# Patient Record
Sex: Female | Born: 2000 | Race: Black or African American | Hispanic: No | Marital: Single | Smoking: Never smoker
Health system: Southern US, Community
[De-identification: ages and names within clinical notes are randomized; demographics above are authoritative.]

---
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
## 2018-07-31 ENCOUNTER — Encounter (HOSPITAL_COMMUNITY): Payer: Self-pay | Admitting: *Deleted

## 2018-07-31 ENCOUNTER — Other Ambulatory Visit: Payer: Self-pay

## 2018-07-31 ENCOUNTER — Emergency Department (HOSPITAL_COMMUNITY)
Admission: EM | Admit: 2018-07-31 | Discharge: 2018-08-01 | Disposition: A | Discharge status: Home / Self Care | Payer: Medicaid Other | Attending: Emergency Medicine | Admitting: Emergency Medicine

## 2018-07-31 DIAGNOSIS — H1132 Conjunctival hemorrhage, left eye: Secondary | ICD-10-CM

## 2018-07-31 NOTE — ED Triage Notes (Signed)
Pt arrives with c/o broken blood vessel in the left eye that she noticed tonight. No pain, no visual changes. No other complaints

## 2018-07-31 NOTE — ED Notes (Signed)
Bed: WTR7 Expected date:  Expected time:  Means of arrival:  Comments: 

## 2018-08-01 NOTE — ED Provider Notes (Signed)
[facility]-EMERGENCY DEPT Provider Note   CSN: [id number] Arrival date & time: 07/31/18  2202     History   Chief Complaint Chief Complaint  Patient presents with  . Eye Problem    HPI Danielle Robertson is a 17 y.o. female.  17 year old female with no significant past medical history presents to the emergency department for evaluation of left subconjunctival hemorrhage.  She noticed it upon waking this morning.  It was not present before bed when she was taking out her contacts.  Denies any recent eye injury or trauma.  No forceful coughing or recent vomiting.  Denies any eye pain or pain with movement.  No vision changes from baseline.  No treatments attempted prior to arrival.     History reviewed. No pertinent past medical history.  There are no active problems to display for this patient.   History reviewed. No pertinent surgical history.   OB History   None      Home Medications    Prior to Admission medications   Medication Sig Start Date End Date Taking? Authorizing Provider  Acetaminophen-Caffeine (EXCEDRIN TENSION HEADACHE) 500-65 MG TABS Take 1 tablet by mouth every 8 (eight) hours as needed (for headache). Patient not taking: Reported on 10/11/2017 07/22/17   Sherrilee Gilles, NP  EUCRISA 2 % OINT Apply 1 application 2 (two) times daily as needed topically (Rash).  09/01/17   [provider]  HYDROcodone-acetaminophen (NORCO/VICODIN) 5-325 MG per tablet Take 1 tablet by mouth every 6 (six) hours as needed for moderate pain. Patient not taking: Reported on 10/11/2017 10/03/13   Earley Favor, NP  hydroxypropyl methylcellulose / hypromellose (ISOPTO TEARS / GONIOVISC) 2.5 % ophthalmic solution Place 1 drop 3 (three) times daily as needed into both eyes for dry eyes.    [provider]  ibuprofen (ADVIL,MOTRIN) 200 MG tablet Take 600-800 mg every 6 (six) hours as needed by mouth for moderate pain.     [provider]    ibuprofen (ADVIL,MOTRIN) 400 MG tablet Take 1 tablet (400 mg total) by mouth every 6 (six) hours as needed. Patient not taking: Reported on 10/11/2017 10/03/13   Earley Favor, NP  ibuprofen (ADVIL,MOTRIN) 400 MG tablet Take 1 tablet (400 mg total) by mouth every 6 (six) hours as needed for headache. Patient not taking: Reported on 10/11/2017 07/22/17   Sherrilee Gilles, NP    Family History No family history on file.  Social History Social History   Tobacco Use  . Smoking status: Never Smoker  . Smokeless tobacco: Never Used  Substance Use Topics  . Alcohol use: No  . Drug use: No     Allergies   Patient has no known allergies.   Review of Systems Review of Systems Ten systems reviewed and are negative for acute change, except as noted in the HPI.    Physical Exam Updated Vital Signs BP 122/81 (BP Location: Left Arm)   Pulse 87   Temp 97.8 F (36.6 C) (Oral)   Resp 18   LMP 07/12/2018   SpO2 100%   Physical Exam  Constitutional: She is oriented to person, place, and time. She appears well-developed and well-nourished. No distress.  Nontoxic appearing.  Pleasant.  HENT:  Head: Normocephalic and atraumatic.  Eyes: EOM are normal. Left conjunctiva has a hemorrhage. No scleral icterus.    Subconjunctival hemorrhage noted in the left eye.  No proptosis or hyphema.  No pain with eye movements.  Denies vision changes.  Visual  Acuity Bilateral Distance: 20/40  R Distance: 20/30  L Distance: 20/40   Neck: Normal range of motion.  Pulmonary/Chest: Effort normal. No respiratory distress.  Respirations even and unlabored  Musculoskeletal: Normal range of motion.  Neurological: She is alert and oriented to person, place, and time. She exhibits normal muscle tone. Coordination normal.  Skin: Skin is warm and dry. No rash noted. She is not diaphoretic. No erythema. No pallor.  Psychiatric: She has a normal mood and affect. Her behavior is normal.  Nursing note and  vitals reviewed.    ED Treatments / Results  Labs (all labs ordered are listed, but only abnormal results are displayed) Labs Reviewed - No data to display  EKG None  Radiology No results found.  Procedures Procedures (including critical care time)  Medications Ordered in ED Medications - No data to display   Initial Impression / Assessment and Plan / ED Course  I have reviewed the triage vital signs and the nursing notes.  Pertinent labs & imaging results that were available during my care of the patient were reviewed by me and considered in my medical decision making (see chart for details).     Patient with uncomplicated subconjunctival hemorrhage.  Will continue with outpatient supportive management.  Advised patient to refrain from contact use until hemorrhage resolves.  Return precautions discussed and provided. Patient discharged in stable condition with no unaddressed concerns.   Final Clinical Impressions(s) / ED Diagnoses   Final diagnoses:  Subconjunctival hemorrhage of left eye    ED Discharge Orders    None       Antony Madura, PA-C 08/01/18 0021    Nira Conn, MD 08/01/18 [phone]

## 2019-08-14 ENCOUNTER — Other Ambulatory Visit: Payer: Self-pay

## 2019-08-14 ENCOUNTER — Encounter: Payer: Self-pay | Admitting: Physician Assistant

## 2019-08-14 ENCOUNTER — Ambulatory Visit
Admission: EM | Admit: 2019-08-14 | Discharge: 2019-08-14 | Disposition: A | Discharge status: Home / Self Care | Payer: Medicaid Other | Attending: Physician Assistant | Admitting: Physician Assistant

## 2019-08-14 DIAGNOSIS — R51 Headache: Secondary | ICD-10-CM

## 2019-08-14 DIAGNOSIS — S060X9A Concussion with loss of consciousness of unspecified duration, initial encounter: Secondary | ICD-10-CM | POA: Diagnosis not present

## 2019-08-14 DIAGNOSIS — W228XXA Striking against or struck by other objects, initial encounter: Secondary | ICD-10-CM | POA: Diagnosis not present

## 2019-08-14 MED ORDER — KETOROLAC TROMETHAMINE 30 MG/ML IJ SOLN
30.0000 mg | Freq: Once | INTRAMUSCULAR | Status: AC
  Administered 2019-08-14: 15:00:00 30 mg via INTRAMUSCULAR

## 2019-08-14 NOTE — Discharge Instructions (Signed)
You can take ibuprofen 600-800mg  three times a day and tylenol 1000mg  three times a day for headache as needed. Expect more frequent headaches that should be improving throughout the week. Avoid using your brain-- no TV, phone, reading. If experiencing blurry vision, nausea/vomiting, confusion/altered mental status, dizziness, weakness, passing out, imbalance, go to the emergency department for further evaluation.

## 2019-08-14 NOTE — ED Triage Notes (Signed)
Per pt she was feeding her dog last night and hit here head on the faucet when she came back up. Went to bed and woke up with headache today. Took a bath and felt dizzy. Has been more painful since she woke up. Pt said no blurred vision, no numbness or tingling. DiD not have LOC

## 2019-08-14 NOTE — ED Provider Notes (Signed)
[facility]    CSN: [id number] Arrival date & time: 08/14/19  1340      History   Chief Complaint Chief Complaint  Patient presents with  . Headache    HPI Greer Eeyasia Buckholtz is a 18 y.o. female.   18 year old female comes in for evaluation after head injury last night.  States hit her head on the faucet when she was standing up.  Denies loss of consciousness.  She was able to fall asleep after incident, but woke up this morning with headache, dizziness/lightheadedness.  She denies nausea, vomiting, photophobia, phonophobia.  She finds it hard to focus, with worsening headache when looking at her phone/TV.  She denies memory loss/trouble remembering.  Denies confusion, altered mental status.  Denies blurry vision.  Patient took Excedrin without much relief.     No past medical history on file.  There are no active problems to display for this patient.   No past surgical history on file.  OB History   No obstetric history on file.      Home Medications    Prior to Admission medications   Medication Sig Start Date End Date Taking? Authorizing Provider  Acetaminophen-Caffeine (EXCEDRIN TENSION HEADACHE) 500-65 MG TABS Take 1 tablet by mouth every 8 (eight) hours as needed (for headache). Patient not taking: Reported on 10/11/2017 07/22/17   Sherrilee GillesScoville, Brittany N, NP  EUCRISA 2 % OINT Apply 1 application 2 (two) times daily as needed topically (Rash).  09/01/17   [provider]  HYDROcodone-acetaminophen (NORCO/VICODIN) 5-325 MG per tablet Take 1 tablet by mouth every 6 (six) hours as needed for moderate pain. Patient not taking: Reported on 10/11/2017 10/03/13   Earley FavorSchulz, Gail, NP  hydroxypropyl methylcellulose / hypromellose (ISOPTO TEARS / GONIOVISC) 2.5 % ophthalmic solution Place 1 drop 3 (three) times daily as needed into both eyes for dry eyes.    [provider]  ibuprofen (ADVIL,MOTRIN) 200 MG tablet Take 600-800 mg every 6 (six) hours as  needed by mouth for moderate pain.     [provider]  ibuprofen (ADVIL,MOTRIN) 400 MG tablet Take 1 tablet (400 mg total) by mouth every 6 (six) hours as needed. Patient not taking: Reported on 10/11/2017 10/03/13   Earley FavorSchulz, Gail, NP  ibuprofen (ADVIL,MOTRIN) 400 MG tablet Take 1 tablet (400 mg total) by mouth every 6 (six) hours as needed for headache. Patient not taking: Reported on 10/11/2017 07/22/17   Sherrilee GillesScoville, Brittany N, NP    Family History No family history on file.  Social History Social History   Tobacco Use  . Smoking status: Never Smoker  . Smokeless tobacco: Never Used  Substance Use Topics  . Alcohol use: No  . Drug use: No     Allergies   Patient has no known allergies.   Review of Systems Review of Systems  Reason unable to perform ROS: See HPI as above.     Physical Exam Triage Vital Signs ED Triage Vitals [08/14/19 1356]  Enc Vitals Group     BP 129/87     Pulse Rate 97     Resp 16     Temp 98 F (36.7 C)     Temp Source Oral     SpO2 98 %     Weight      Height      Head Circumference      Peak Flow      Pain Score 9     Pain Loc  Pain Edu?      Excl. in [place]?    No data found.  Updated Vital Signs BP 129/87 (BP Location: Right Arm)   Pulse 97   Temp 98 F (36.7 C) (Oral)   Resp 16   LMP 08/07/2019   SpO2 98%   Physical Exam Constitutional:      General: She is not in acute distress.    Appearance: She is well-developed. She is not ill-appearing, toxic-appearing or diaphoretic.  HENT:     Head: Normocephalic and atraumatic.  Eyes:     General: Lids are normal.     Extraocular Movements: Extraocular movements intact.     Conjunctiva/sclera: Conjunctivae normal.     Pupils: Pupils are equal, round, and reactive to light.  Neck:     Musculoskeletal: Normal range of motion and neck supple.  Cardiovascular:     Rate and Rhythm: Normal rate and regular rhythm.     Heart sounds: No murmur. No friction rub. No gallop.    Pulmonary:     Effort: Pulmonary effort is normal. No respiratory distress.     Breath sounds: Normal breath sounds.  Skin:    General: Skin is warm and dry.  Neurological:     Mental Status: She is alert and oriented to person, place, and time.     GCS: GCS eye subscore is 4. GCS verbal subscore is 5. GCS motor subscore is 6.     Cranial Nerves: Cranial nerves are intact.     Sensory: Sensation is intact.     Motor: Motor function is intact.     Coordination: Coordination is intact.     Gait: Gait is intact.     UC Treatments / Results  Labs (all labs ordered are listed, but only abnormal results are displayed) Labs Reviewed - No data to display  EKG   Radiology No results found.  Procedures Procedures (including critical care time)  Medications Ordered in UC Medications  ketorolac (TORADOL) 30 MG/ML injection 30 mg (30 mg Intramuscular Given 08/14/19 1430)    Initial Impression / Assessment and Plan / UC Course  I have reviewed the triage vital signs and the nursing notes.  Pertinent labs & imaging results that were available during my care of the patient were reviewed by me and considered in my medical decision making (see chart for details).    History and exam most consistent with concussion. Will provide symptomatic treatment and continue to monitor. Discussed brain rest. Return precautions given. Otherwise, if still having headaches in 1 week, to follow up with concussion clinic for further evaluation and management needed.  Final Clinical Impressions(s) / UC Diagnoses   Final diagnoses:  Concussion with loss of consciousness, initial encounter   ED Prescriptions    None     PDMP not reviewed this encounter.   Ok Edwards, PA-C 08/14/19 1445

## 2019-09-02 ENCOUNTER — Ambulatory Visit: Payer: Medicaid Other | Admitting: Nurse Practitioner

## 2019-09-22 ENCOUNTER — Ambulatory Visit: Payer: Medicaid Other | Admitting: Obstetrics & Gynecology

## 2019-09-28 ENCOUNTER — Ambulatory Visit: Payer: Medicaid Other

## 2019-11-08 ENCOUNTER — Ambulatory Visit (INDEPENDENT_AMBULATORY_CARE_PROVIDER_SITE_OTHER): Payer: Medicaid Other | Admitting: Advanced Practice Midwife

## 2019-11-08 ENCOUNTER — Other Ambulatory Visit (HOSPITAL_COMMUNITY)
Admission: RE | Admit: 2019-11-08 | Discharge: 2019-11-08 | Disposition: A | Discharge status: Home / Self Care | Payer: Medicaid Other | Source: Ambulatory Visit | Attending: Advanced Practice Midwife | Admitting: Advanced Practice Midwife

## 2019-11-08 ENCOUNTER — Encounter: Payer: Self-pay | Admitting: Advanced Practice Midwife

## 2019-11-08 ENCOUNTER — Other Ambulatory Visit: Payer: Self-pay

## 2019-11-08 VITALS — BP 122/81 | HR 92 | Ht 67.0 in | Wt 127.2 lb

## 2019-11-08 DIAGNOSIS — N898 Other specified noninflammatory disorders of vagina: Secondary | ICD-10-CM | POA: Insufficient documentation

## 2019-11-08 DIAGNOSIS — Z0000 Encounter for general adult medical examination without abnormal findings: Secondary | ICD-10-CM

## 2019-11-08 DIAGNOSIS — B9689 Other specified bacterial agents as the cause of diseases classified elsewhere: Secondary | ICD-10-CM

## 2019-11-08 DIAGNOSIS — R350 Frequency of micturition: Secondary | ICD-10-CM | POA: Diagnosis not present

## 2019-11-08 DIAGNOSIS — N939 Abnormal uterine and vaginal bleeding, unspecified: Secondary | ICD-10-CM

## 2019-11-08 LAB — POCT URINALYSIS DIPSTICK
Bilirubin, UA: NEGATIVE
Blood, UA: NEGATIVE
Glucose, UA: NEGATIVE
Ketones, UA: NEGATIVE
Leukocytes, UA: NEGATIVE
Nitrite, UA: NEGATIVE
Odor: NEGATIVE
Protein, UA: NEGATIVE
Spec Grav, UA: 1.015 (ref 1.010–1.025)
Urobilinogen, UA: 0.2 E.U./dL
pH, UA: 6 (ref 5.0–8.0)

## 2019-11-08 MED ORDER — NORETHIN-ETH ESTRAD-FE BIPHAS 1 MG-10 MCG / 10 MCG PO TABS: 1.0000 | ORAL_TABLET | Freq: Every day | ORAL | 11 refills | Status: DC

## 2019-11-08 MED ORDER — NORETHIN ACE-ETH ESTRAD-FE 1-20 MG-MCG(24) PO TABS: 1.0000 | ORAL_TABLET | Freq: Every day | ORAL | 11 refills | Status: DC

## 2019-11-08 NOTE — Patient Instructions (Addendum)
Some natural remedies/prevention to try for bacterial vaginosis: --Take a probiotic tablet/capsule every day for at least 1-2 months.   --Whenever you have symptoms, use boric acid suppositories vaginally every night for a week.   --Do not use scented soaps/perfumes in the vaginal area, and do not overwash multiple times daily. --Wear breathable cotton underwear and do not wear tight restrictive clothing. --Limit pantyliner use, change your underwear several times daily instead. --Use condoms during intercourse.    Oral Contraception Information Oral contraceptive pills (OCPs) are medicines taken to prevent pregnancy. OCPs are taken by mouth, and they work by:  Preventing the ovaries from releasing eggs.  Thickening mucus in the lower part of the uterus (cervix), which prevents sperm from entering the uterus.  Thinning the lining of the uterus (endometrium), which prevents a fertilized egg from attaching to the endometrium. OCPs are highly effective when taken exactly as prescribed. However, OCPs do not prevent STIs (sexually transmitted infections). Safe sex practices, such as using condoms while on an OCP, can help prevent STIs. Before starting OCPs Before you start taking OCPs, you may have a physical exam, blood test, and Pap test. However, you are not required to have a pelvic exam in order to be prescribed OCPs. Your health care provider will make sure you are a good candidate for oral contraception. OCPs are not a good option for certain women, including women who smoke and are older than 35 years, and women with a medical history of high blood pressure, deep vein thrombosis, pulmonary embolism, stroke, cardiovascular disease, or peripheral vascular disease. Discuss with your health care provider the possible side effects of the OCP you may be prescribed. When you start an OCP, be aware that it can take 2-3 months for your body to adjust to changes in hormone levels. Follow  instructions from your health care provider about how to start taking your first cycle of OCPs. Depending on when you start the pill, you may need to use a backup form of birth control, such as condoms, during the first week. Make sure you know what steps to take if you ever forget to take the pill. Types of oral contraception  The most common types of birth control pills contain the hormones estrogen and progestin (synthetic progesterone) or progestin only. The combination pill This type of pill contains estrogen and progestin hormones. Combination pills often come in packs of 21, 28, or 91 pills. For each pack, the last 7 pills may not contain hormones, which means you may stop taking the pills for 7 days. Menstrual bleeding occurs during the week that you do not take the pills or that you take the pills with no hormones in them. The minipill This type of pill contains the progestin hormone only. It comes in packs of 28 pills. All 28 pills contain the hormone. You take the pill every day. It is very important to take the pill at the same time each day. Advantages of oral contraceptive pills  Provides reliable and continuous contraception if taken as instructed.  May treat or decrease symptoms of: ? Menstrual period cramps. ? Irregular menstrual cycle or bleeding. ? Heavy menstrual flow. ? Abnormal uterine bleeding. ? Acne, depending on the type of pill. ? Polycystic ovarian syndrome. ? Endometriosis. ? Iron deficiency anemia. ? Premenstrual symptoms, including premenstrual dysphoric disorder.  May reduce the risk of endometrial and ovarian cancer.  Can be used as emergency contraception.  Prevents mislocated (ectopic) pregnancies and infections of the fallopian tubes.  Things that can make oral contraceptive pills less effective OCPs can be less effective if:  You forget to take the pill at the same time every day. This is especially important when taking the minipill.  You have a  stomach or intestinal disease that reduces your body's ability to absorb the pill.  You take OCPs with other medicines that make OCPs less effective, such as antibiotics, certain HIV medicines, and some seizure medicines.  You take expired OCPs.  You forget to restart the pill on day 7, if using the packs of 21 pills. Risks associated with oral contraceptive pills Oral contraceptive pills can sometimes cause side effects, such as:  Headache.  Depression.  Trouble sleeping.  Nausea and vomiting.  Breast tenderness.  Irregular bleeding or spotting during the first several months.  Bloating or fluid retention.  Increase in blood pressure. Combination pills are also associated with a small increase in the risk of:  Blood clots.  Heart attack.  Stroke. Summary  Oral contraceptive pills are medicines taken by mouth to prevent pregnancy. They are highly effective when taken exactly as prescribed.  The most common types of birth control pills contain the hormones estrogen and progestin (synthetic progesterone) or progestin only.  Before you start taking the pill, you may have a physical exam, blood test, and Pap test. Your health care provider will make sure you are a good candidate for oral contraception.  The combination pill may come in a 21-day pack, a 28-day pack, or a 91-day pack. The minipill contains the progesterone hormone only and comes in packs of 28 pills.  Oral contraceptive pills can sometimes cause side effects, such as headache, nausea, breast tenderness, or irregular bleeding. This information is not intended to replace advice given to you by your health care provider. Make sure you discuss any questions you have with your health care provider. Document Released: 02/01/2003 Document Revised: 10/24/2017 Document Reviewed: 02/04/2017 Elsevier Patient Education  2020 [employer].

## 2019-11-08 NOTE — Progress Notes (Signed)
Subjective:     Danielle Robertson is a 18 y.o. female here for a routine exam.  Current complaints: Patient states that she has had increasing vaginal itching and "bad odor" for the last 2 months. She endorses clear watery vaginal discharge with no blood. She also endorses more frequent urination and increased urgency (every 10 minutes). Denies dysuria, hematuria.   Patient also reports irregular periods. She states that her periods recur approximately 2x per month and follow an unpredictable pattern. She endorses "heavy" periods that require changing pads every hour without saturating pads.  She denies weight gain or loss, changes in appetite, anxiety, sweats, chills. No changes in BMs, no increase in thirst.   PHQ-2: negative  Do you have a primary care provider? Yes - [facility] and [place]. Recently made an appointment with dentist. How many times per week do you exercise? 0 Do you feel safe at home? Yes Has anyone hit, slapped, or kicked you recently? No Do you feel sad, tired, or upset most days or are you mostly happy with life? No  Social History: Patient denies sexual activity of all forms. She has never smoked or used tobacco products. No drug use. She endorses occasional alcohol use on holidays.  Past Medical History: - Migraines w/o aura - Eczema  Medications: None   Gynecologic History LMP: 10/25/19 Contraception: none. Not sexually active. Last Pap: N/A.  Obstetric History OB History  Gravida Para Term Preterm AB Living  0 0 0 0 0 0  SAB TAB Ectopic Multiple Live Births  0 0 0 0 0   Review of Systems Pertinent items are noted in HPI.    Objective:   VS: BP 122/81, HR 92, BMI 19.92  CV: Normal S1, S2. No murmurs, rubs, gallops. 2+ radial, brachial, dorsalis pedis pulses Pulmonary: CTAB. Normal respiratory effort. No wheezes Abdominal: Soft, non-distended, non-tender to palpation Pelvic: No vaginal erythema, discharge, rashes.    Assessment/Plan:   There are no diagnoses linked to this encounter.   1. Urinary frequency Pt has recent-onset increased urinary frequency and urgency raise concern for possible UTI vs. Cystitis vs. Candidal vaginitis. Given family history of diabetes, will check A1c to rule out diabetes.  - Urine dip - Urine Culture-GYN - Hemoglobin A1c  2. Vaginal discharge Pt endorses vaginal discharge with pruritus and odor changes in the setting of no history of sexual activity. Will evaluate for Candida, BV. - Cervicovaginal ancillary only( [place])  3. Abnormal uterine bleeding (AUB) Pt's history of irregular periods since menarche is likely idiopathic. Potential anovulatory cycles. Normal BMI and consistent irregular period pattern makes PCOS less likely.  -  Norethindrone-Ethinyl Estradiol-Fe Biphas (LO LOESTRIN FE) 1 MG-10 MCG / 10 MCG tablet; Take 1 tablet by mouth daily.  Dispense: 1 Package; Refill: 11  4. Well woman exam (no gynecological exam) Patient is doing well with no major concerns. We provided anticipatory guidance about contraception and prevention of vaginal infections. Advised patient to call or return to clinic if any issues or questions arise.                                 Follow up in: 3 months or as needed.   Cristela Felt, Medical Student 3:27 PM

## 2019-11-08 NOTE — Progress Notes (Signed)
NGYN c/o vaginal discharge with itching and odor.  Pt never sexually active. Pt also c/o urinary frequency. Voids every 10 mins sometimes.  Cycles tend to come late and when they do, it's heavy changing pads every hr or less. Requests BCP to help regulate cycles.

## 2019-11-09 LAB — CERVICOVAGINAL ANCILLARY ONLY
Bacterial Vaginitis (gardnerella): POSITIVE — AB
Candida Glabrata: NEGATIVE
Candida Vaginitis: NEGATIVE
Chlamydia: NEGATIVE
Comment: NEGATIVE
Comment: NEGATIVE
Comment: NEGATIVE
Comment: NEGATIVE
Comment: NEGATIVE
Comment: NORMAL
Neisseria Gonorrhea: NEGATIVE
Trichomonas: NEGATIVE

## 2019-11-09 LAB — HEMOGLOBIN A1C
Est. average glucose Bld gHb Est-mCnc: 97 mg/dL
Hgb A1c MFr Bld: 5 % (ref 4.8–5.6)

## 2019-11-10 LAB — URINE CULTURE

## 2019-11-11 MED ORDER — METRONIDAZOLE 500 MG PO TABS: 500.0000 mg | ORAL_TABLET | Freq: Two times a day (BID) | ORAL | 0 refills | Status: AC

## 2019-11-11 NOTE — Addendum Note (Signed)
Addended by: Fatima Blank A on: 11/11/2019 05:09 PM   Modules accepted: Orders

## 2019-11-18 ENCOUNTER — Ambulatory Visit: Payer: Medicaid Other | Attending: Internal Medicine

## 2019-11-18 DIAGNOSIS — Z20822 Contact with and (suspected) exposure to covid-19: Secondary | ICD-10-CM

## 2019-11-20 LAB — NOVEL CORONAVIRUS, NAA: SARS-CoV-2, NAA: NOT DETECTED

## 2019-12-04 ENCOUNTER — Other Ambulatory Visit: Payer: Self-pay

## 2019-12-04 ENCOUNTER — Ambulatory Visit
Admission: EM | Admit: 2019-12-04 | Discharge: 2019-12-04 | Disposition: A | Discharge status: Home / Self Care | Payer: Medicaid Other | Attending: Physician Assistant | Admitting: Physician Assistant

## 2019-12-04 ENCOUNTER — Encounter: Payer: Self-pay | Admitting: Emergency Medicine

## 2019-12-04 DIAGNOSIS — R059 Cough, unspecified: Secondary | ICD-10-CM

## 2019-12-04 DIAGNOSIS — R05 Cough: Secondary | ICD-10-CM | POA: Diagnosis not present

## 2019-12-04 DIAGNOSIS — Z20822 Contact with and (suspected) exposure to covid-19: Secondary | ICD-10-CM | POA: Diagnosis not present

## 2019-12-04 MED ORDER — ACETAMINOPHEN 500 MG PO TABS: 1000.0000 mg | ORAL_TABLET | Freq: Three times a day (TID) | ORAL | 0 refills | Status: DC | PRN

## 2019-12-04 MED ORDER — IBUPROFEN 800 MG PO TABS: 800.0000 mg | ORAL_TABLET | Freq: Three times a day (TID) | ORAL | 0 refills | Status: DC

## 2019-12-04 MED ORDER — FLUTICASONE PROPIONATE 50 MCG/ACT NA SUSP: 2.0000 | Freq: Every day | NASAL | 0 refills | Status: DC

## 2019-12-04 MED ORDER — ALBUTEROL SULFATE HFA 108 (90 BASE) MCG/ACT IN AERS: 1.0000 | INHALATION_SPRAY | Freq: Four times a day (QID) | RESPIRATORY_TRACT | 0 refills | Status: DC | PRN

## 2019-12-04 NOTE — ED Triage Notes (Signed)
PT presents to [facility] for assessment of headaches, body aches, scratchy throat, upper chest tightness, and temp of 99.9.

## 2019-12-04 NOTE — Discharge Instructions (Signed)
COVID PCR testing ordered. As discussed, given your exposure, I would like you to quarantine for 14 days regardless of results. Tylenol/motrin for pain and fever. Albuterol as needed for chest tightness. You can take over the counter flonase/nasacort to help with nasal congestion/drainage. If experiencing shortness of breath, trouble breathing, go to the emergency department for further evaluation needed.

## 2019-12-04 NOTE — ED Notes (Signed)
Pt updated on delays for room.

## 2019-12-04 NOTE — ED Provider Notes (Signed)
[facility]    CSN: [id number] Arrival date & time: 12/04/19  1025      History   Chief Complaint Chief Complaint  Patient presents with  . Exposure to COVID    HPI Danielle Robertson is a 19 y.o. female.   19 year old female comes in for 2 day of URI symptoms. Throat irritation, minimal cough, headache. Denies nasal congestion, rhinorrhea. Had some chest tightness when laying down, but without shortness of breath, trouble breathing. Body aches, chills with tmax 99.9. has had some periumibilical abdominal cramping that makes her feel like she needs to move her bowels. Has not had BM these past 2 days. Denies nausea, vomiting, diarrhea. Denies loss of taste/smell.      History reviewed. No pertinent past medical history.  There are no problems to display for this patient.   History reviewed. No pertinent surgical history.  OB History    Gravida  0   Para  0   Term  0   Preterm  0   AB  0   Living  0     SAB  0   TAB  0   Ectopic  0   Multiple  0   Live Births  0            Home Medications    Prior to Admission medications   Medication Sig Start Date End Date Taking? Authorizing Provider  acetaminophen (TYLENOL) 500 MG tablet Take 2 tablets (1,000 mg total) by mouth every 8 (eight) hours as needed. 12/04/19   Belinda Fisher, PA-C  Acetaminophen-Caffeine (EXCEDRIN TENSION HEADACHE) 500-65 MG TABS Take 1 tablet by mouth every 8 (eight) hours as needed (for headache). Patient not taking: Reported on 10/11/2017 07/22/17   Sherrilee Gilles, NP  albuterol (VENTOLIN HFA) 108 (90 Base) MCG/ACT inhaler Inhale 1-2 puffs into the lungs every 6 (six) hours as needed for wheezing or shortness of breath. 12/04/19   Cathie Hoops, Emaley Applin V, PA-C  EUCRISA 2 % OINT Apply 1 application 2 (two) times daily as needed topically (Rash).  09/01/17   [provider]  fluticasone (FLONASE) 50 MCG/ACT nasal spray Place 2 sprays into both nostrils daily. 12/04/19   Cathie Hoops, Yuridiana Formanek V,  PA-C  hydroxypropyl methylcellulose / hypromellose (ISOPTO TEARS / GONIOVISC) 2.5 % ophthalmic solution Place 1 drop 3 (three) times daily as needed into both eyes for dry eyes.    [provider]  ibuprofen (ADVIL) 800 MG tablet Take 1 tablet (800 mg total) by mouth 3 (three) times daily. 12/04/19   Cathie Hoops, Eulia Hatcher V, PA-C  Norethindrone-Ethinyl Estradiol-Fe Biphas (LO LOESTRIN FE) 1 MG-10 MCG / 10 MCG tablet Take 1 tablet by mouth daily. 11/08/19   Leftwich-Kirby, Wilmer Floor, CNM    Family History Family History  Problem Relation Age of Onset  . Diabetes Maternal Grandmother   . Diabetes Maternal Grandfather   . COPD Paternal Grandmother        Smoker     Social History Social History   Tobacco Use  . Smoking status: Never Smoker  . Smokeless tobacco: Never Used  Substance Use Topics  . Alcohol use: No  . Drug use: No     Allergies   Patient has no known allergies.   Review of Systems Review of Systems  Reason unable to perform ROS: See HPI as above.     Physical Exam Triage Vital Signs ED Triage Vitals [12/04/19 1111]  Enc Vitals Group  BP 119/84     Pulse Rate (!) 102     Resp 16     Temp (!) 97.5 F (36.4 C)     Temp Source Temporal     SpO2 99 %     Weight      Height      Head Circumference      Peak Flow      Pain Score 7     Pain Loc      Pain Edu?      Excl. in [place]?    No data found.  Updated Vital Signs BP 119/84 (BP Location: Left Arm)   Pulse (!) 102   Temp (!) 97.5 F (36.4 C) (Temporal)   Resp 16   LMP 11/21/2019   SpO2 99%   Physical Exam Constitutional:      General: She is not in acute distress.    Appearance: Normal appearance. She is not ill-appearing, toxic-appearing or diaphoretic.  HENT:     Head: Normocephalic and atraumatic.     Nose:     Right Sinus: No maxillary sinus tenderness or frontal sinus tenderness.     Left Sinus: No maxillary sinus tenderness or frontal sinus tenderness.     Mouth/Throat:     Mouth: Mucous  membranes are moist.     Pharynx: Oropharynx is clear. Uvula midline.  Cardiovascular:     Rate and Rhythm: Normal rate and regular rhythm.     Heart sounds: Normal heart sounds. No murmur. No friction rub. No gallop.   Pulmonary:     Effort: Pulmonary effort is normal. No accessory muscle usage, prolonged expiration, respiratory distress or retractions.     Comments: Lungs clear to auscultation without adventitious lung sounds. Musculoskeletal:     Cervical back: Normal range of motion and neck supple.  Neurological:     General: No focal deficit present.     Mental Status: She is alert and oriented to person, place, and time.      UC Treatments / Results  Labs (all labs ordered are listed, but only abnormal results are displayed) Labs Reviewed  NOVEL CORONAVIRUS, NAA    EKG   Radiology No results found.  Procedures Procedures (including critical care time)  Medications Ordered in UC Medications - No data to display  Initial Impression / Assessment and Plan / UC Course  I have reviewed the triage vital signs and the nursing notes.  Pertinent labs & imaging results that were available during my care of the patient were reviewed by me and considered in my medical decision making (see chart for details).    COVID PCR test ordered. Patient presumed positive regardless of testing results given history and exposure. No alarming signs on exam.  Patient speaking in full sentences without respiratory distress.  Symptomatic treatment discussed.  Push fluids.  Return precautions given.  Patient expresses understanding and agrees to plan.  Final Clinical Impressions(s) / UC Diagnoses   Final diagnoses:  Cough  Suspected COVID-19 virus infection  Close exposure to COVID-19 virus   ED Prescriptions    Medication Sig Dispense Auth. Provider   ibuprofen (ADVIL) 800 MG tablet Take 1 tablet (800 mg total) by mouth 3 (three) times daily. 21 tablet Tanaiya Kolarik V, PA-C   acetaminophen  (TYLENOL) 500 MG tablet Take 2 tablets (1,000 mg total) by mouth every 8 (eight) hours as needed. 30 tablet Rea Reser V, PA-C   albuterol (VENTOLIN HFA) 108 (90 Base) MCG/ACT inhaler Inhale 1-2  puffs into the lungs every 6 (six) hours as needed for wheezing or shortness of breath. 8 g Joffre Lucks V, PA-C   fluticasone (FLONASE) 50 MCG/ACT nasal spray Place 2 sprays into both nostrils daily. 1 g Ok Edwards, PA-C     PDMP not reviewed this encounter.   Ok Edwards, PA-C 12/04/19 1156

## 2019-12-05 LAB — NOVEL CORONAVIRUS, NAA: SARS-CoV-2, NAA: DETECTED — AB

## 2019-12-06 ENCOUNTER — Telehealth (HOSPITAL_COMMUNITY): Payer: Self-pay | Admitting: Emergency Medicine

## 2019-12-06 ENCOUNTER — Encounter (HOSPITAL_COMMUNITY): Payer: Self-pay

## 2019-12-06 ENCOUNTER — Other Ambulatory Visit: Payer: Medicaid Other

## 2019-12-06 NOTE — Telephone Encounter (Signed)
Your test for COVID-19 was positive, meaning that you were infected with the novel coronavirus and could give the germ to others.  Please continue isolation at home for at least 10 days since the start of your symptoms. If you do not have symptoms, please isolate at home for 10 days from the day you were tested. Once you complete your 10 day quarantine, you may return to normal activities as long as you've not had a fever for over 24 hours(without taking fever reducing medicine) and your symptoms are improving. Please continue good preventive care measures, including:  frequent hand-washing, avoid touching your face, cover coughs/sneezes, stay out of crowds and keep a 6 foot distance from others.  Go to the nearest hospital emergency room if fever/cough/breathlessness are severe or illness seems like a threat to life.  Attempted to reach patient. No answer at this time. Voicemail box full.   If you have any questions, you may call me at [phone]

## 2019-12-07 ENCOUNTER — Telehealth (HOSPITAL_COMMUNITY): Payer: Self-pay | Admitting: Emergency Medicine

## 2019-12-07 NOTE — Telephone Encounter (Signed)
Patient contacted by phone and made aware of   pos covid results. Pt verbalized understanding and had all questions answered.    

## 2020-02-11 ENCOUNTER — Ambulatory Visit: Payer: Medicaid Other

## 2020-02-14 ENCOUNTER — Ambulatory Visit: Payer: Medicaid Other

## 2020-03-06 ENCOUNTER — Ambulatory Visit: Payer: Medicaid Other

## 2020-03-23 ENCOUNTER — Ambulatory Visit (INDEPENDENT_AMBULATORY_CARE_PROVIDER_SITE_OTHER): Payer: Medicaid Other | Admitting: *Deleted

## 2020-03-23 ENCOUNTER — Other Ambulatory Visit (HOSPITAL_COMMUNITY)
Admission: RE | Admit: 2020-03-23 | Discharge: 2020-03-23 | Disposition: A | Discharge status: Home / Self Care | Payer: Medicaid Other | Source: Ambulatory Visit | Attending: Obstetrics and Gynecology | Admitting: Obstetrics and Gynecology

## 2020-03-23 ENCOUNTER — Other Ambulatory Visit: Payer: Self-pay

## 2020-03-23 DIAGNOSIS — N898 Other specified noninflammatory disorders of vagina: Secondary | ICD-10-CM | POA: Diagnosis present

## 2020-03-23 NOTE — Progress Notes (Signed)
SUBJECTIVE:  19 y.o. female complains of thin vaginal discharge with some odor for 2 weeks. Denies abnormal vaginal bleeding or significant pelvic pain or fever. No UTI symptoms. Denies history of known exposure to STD.   OBJECTIVE:  She appears well, afebrile.   ASSESSMENT:  Vaginal Discharge  Vaginal Odor   PLAN:  GC, chlamydia, trichomonas, BVAG, CVAG probe sent to lab. Treatment: To be determined once lab results are received ROV prn if symptoms persist or worsen.

## 2020-03-23 NOTE — Progress Notes (Signed)
Agree with A & P. 

## 2020-03-24 LAB — CERVICOVAGINAL ANCILLARY ONLY
Bacterial Vaginitis (gardnerella): NEGATIVE
Candida Glabrata: NEGATIVE
Candida Vaginitis: NEGATIVE
Chlamydia: NEGATIVE
Comment: NEGATIVE
Comment: NEGATIVE
Comment: NEGATIVE
Comment: NEGATIVE
Comment: NEGATIVE
Comment: NORMAL
Neisseria Gonorrhea: NEGATIVE
Trichomonas: NEGATIVE

## 2020-05-11 ENCOUNTER — Ambulatory Visit: Payer: Medicaid Other | Admitting: Women's Health

## 2020-05-17 ENCOUNTER — Ambulatory Visit
Admission: EM | Admit: 2020-05-17 | Discharge: 2020-05-17 | Disposition: A | Discharge status: Home / Self Care | Payer: Medicaid Other | Attending: Emergency Medicine | Admitting: Emergency Medicine

## 2020-05-17 DIAGNOSIS — N760 Acute vaginitis: Secondary | ICD-10-CM | POA: Diagnosis present

## 2020-05-17 MED ORDER — FLUCONAZOLE 150 MG PO TABS: 150.0000 mg | ORAL_TABLET | Freq: Every day | ORAL | 0 refills | Status: DC

## 2020-05-17 NOTE — ED Triage Notes (Signed)
Pt presents to UC for vaginal itching and dischage x2 weeks. Pt also endorses new onset vaginal odor. Pt states she took an old antibiotic for itching, with out relief. Pt denies urinary frequency, painful urination, or abdominal pain. Pt denies n/v/d, fevers or chills.

## 2020-05-17 NOTE — ED Provider Notes (Signed)
[facility]    CSN: [id number] Arrival date & time: 05/17/20  1252      History   Chief Complaint Chief Complaint  Patient presents with  . Vaginal Itching    HPI Danielle Robertson is a 19 y.o. female presenting for 2-week course and irritation.  Endorsing thick, white discharge as well.  No pelvic pain, vaginal bleeding malodor.  No urinary symptoms such as urgency or frequency.  Patient states she has been taking antibiotic for itching without relief.  Patient shows supplement: Prebiotic and probiotic.  No fever, abdominal or back pain.  History reviewed. No pertinent past medical history.  There are no problems to display for this patient.   History reviewed. No pertinent surgical history.  OB History    Gravida  0   Para  0   Term  0   Preterm  0   AB  0   Living  0     SAB  0   TAB  0   Ectopic  0   Multiple  0   Live Births  0            Home Medications    Prior to Admission medications   Medication Sig Start Date End Date Taking? Authorizing Provider  fluconazole (DIFLUCAN) 150 MG tablet Take 1 tablet (150 mg total) by mouth daily. May repeat in 72 hours if needed 05/17/20   Hall-Potvin, [place], PA-C  fluticasone (FLONASE) 50 MCG/ACT nasal spray Place 2 sprays into both nostrils daily. 12/04/19   Tasia Catchings, Amy V, PA-C  albuterol (VENTOLIN HFA) 108 (90 Base) MCG/ACT inhaler Inhale 1-2 puffs into the lungs every 6 (six) hours as needed for wheezing or shortness of breath. 12/04/19 05/17/20  Tasia Catchings, Amy V, PA-C  Norethindrone-Ethinyl Estradiol-Fe Biphas (LO LOESTRIN FE) 1 MG-10 MCG / 10 MCG tablet Take 1 tablet by mouth daily. 11/08/19 05/17/20  Leftwich-Kirby, Kathie Dike, CNM    Family History Family History  Problem Relation Age of Onset  . Diabetes Maternal Grandmother   . Diabetes Maternal Grandfather   . COPD Paternal Grandmother        Smoker     Social History Social History   Tobacco Use  . Smoking status: Never Smoker  . Smokeless  tobacco: Never Used  Vaping Use  . Vaping Use: Never used  Substance Use Topics  . Alcohol use: No  . Drug use: No     Allergies   Patient has no known allergies.   Review of Systems As per HPI   Physical Exam Triage Vital Signs ED Triage Vitals  Enc Vitals Group     BP      Pulse      Resp      Temp      Temp src      SpO2      Weight      Height      Head Circumference      Peak Flow      Pain Score      Pain Loc      Pain Edu?      Excl. in [place]?    No data found.  Updated Vital Signs BP (!) 122/92 (BP Location: Left Arm)   Pulse 89   Temp 97.8 F (36.6 C) (Oral)   Resp 18   LMP 04/28/2020 (Exact Date)   SpO2 99%   Visual Acuity Right Eye Distance:   Left Eye Distance:   Bilateral Distance:  Right Eye Near:   Left Eye Near:    Bilateral Near:     Physical Exam Constitutional:      General: She is not in acute distress. HENT:     Head: Normocephalic and atraumatic.  Eyes:     General: No scleral icterus.    Pupils: Pupils are equal, round, and reactive to light.  Cardiovascular:     Rate and Rhythm: Normal rate.  Pulmonary:     Effort: Pulmonary effort is normal.  Abdominal:     General: Bowel sounds are normal.     Palpations: Abdomen is soft.     Tenderness: There is no abdominal tenderness. There is no right CVA tenderness, left CVA tenderness or guarding.  Genitourinary:    Comments: Patient declined, self-swab performed Skin:    Coloration: Skin is not jaundiced or pale.  Neurological:     Mental Status: She is alert and oriented to person, place, and time.      UC Treatments / Results  Labs (all labs ordered are listed, but only abnormal results are displayed) Labs Reviewed  CERVICOVAGINAL ANCILLARY ONLY    EKG   Radiology No results found.  Procedures Procedures (including critical care time)  Medications Ordered in UC Medications - No data to display  Initial Impression / Assessment and Plan / UC Course  I  have reviewed the triage vital signs and the nursing notes.  Pertinent labs & imaging results that were available during my care of the patient were reviewed by me and considered in my medical decision making (see chart for details).     H&P consistent with yeast vaginitis.  Diflucan sent.  Patient requesting testing to confirm as well as BV she has a history of this, though states it feels different.  No concern for pregnancy as patient has not had coitus: LMP 04/28/2020.  Cytology pending: Patient to check MyChart for results.  Return precautions discussed, patient verbalized understanding and is agreeable to plan. Final Clinical Impressions(s) / UC Diagnoses   Final diagnoses:  Acute vaginitis     Discharge Instructions     Take Diflucan (yeast medication) once a day, no 3 days. Please check your MyChart for your test results. Important follow-up with OB/GYN, PCP, or health department for contraceptive management.    ED Prescriptions    Medication Sig Dispense Auth. Provider   fluconazole (DIFLUCAN) 150 MG tablet Take 1 tablet (150 mg total) by mouth daily. May repeat in 72 hours if needed 2 tablet Hall-Potvin, [place], PA-C     PDMP not reviewed this encounter.   Hall-Potvin, [place], [place] 05/17/20 1335

## 2020-05-17 NOTE — Discharge Instructions (Signed)
Take Diflucan (yeast medication) once a day, no 3 days. Please check your MyChart for your test results. Important follow-up with OB/GYN, PCP, or health department for contraceptive management.

## 2020-05-18 LAB — CERVICOVAGINAL ANCILLARY ONLY
Bacterial Vaginitis (gardnerella): NEGATIVE
Candida Glabrata: NEGATIVE
Candida Vaginitis: POSITIVE — AB
Comment: NEGATIVE
Comment: NEGATIVE
Comment: NEGATIVE

## 2020-05-22 ENCOUNTER — Ambulatory Visit: Payer: Medicaid Other | Admitting: Obstetrics and Gynecology

## 2020-05-22 ENCOUNTER — Ambulatory Visit: Payer: Medicaid Other | Attending: Internal Medicine

## 2020-05-22 DIAGNOSIS — Z20822 Contact with and (suspected) exposure to covid-19: Secondary | ICD-10-CM

## 2020-05-23 LAB — NOVEL CORONAVIRUS, NAA: SARS-CoV-2, NAA: NOT DETECTED

## 2020-05-23 LAB — SARS-COV-2, NAA 2 DAY TAT

## 2021-02-15 ENCOUNTER — Ambulatory Visit (INDEPENDENT_AMBULATORY_CARE_PROVIDER_SITE_OTHER): Payer: Medicaid Other | Admitting: Obstetrics

## 2021-02-15 ENCOUNTER — Encounter: Payer: Self-pay | Admitting: Obstetrics

## 2021-02-15 ENCOUNTER — Other Ambulatory Visit: Payer: Self-pay

## 2021-02-15 ENCOUNTER — Other Ambulatory Visit (HOSPITAL_COMMUNITY)
Admission: RE | Admit: 2021-02-15 | Discharge: 2021-02-15 | Disposition: A | Discharge status: Home / Self Care | Payer: Medicaid Other | Source: Ambulatory Visit | Attending: Obstetrics | Admitting: Obstetrics

## 2021-02-15 VITALS — BP 124/84 | HR 85 | Ht 67.5 in | Wt 121.0 lb

## 2021-02-15 DIAGNOSIS — N898 Other specified noninflammatory disorders of vagina: Secondary | ICD-10-CM | POA: Insufficient documentation

## 2021-02-15 DIAGNOSIS — Z30011 Encounter for initial prescription of contraceptive pills: Secondary | ICD-10-CM

## 2021-02-15 DIAGNOSIS — Z3009 Encounter for other general counseling and advice on contraception: Secondary | ICD-10-CM

## 2021-02-15 DIAGNOSIS — N946 Dysmenorrhea, unspecified: Secondary | ICD-10-CM | POA: Diagnosis not present

## 2021-02-15 DIAGNOSIS — Z01419 Encounter for gynecological examination (general) (routine) without abnormal findings: Secondary | ICD-10-CM | POA: Diagnosis present

## 2021-02-15 DIAGNOSIS — Z113 Encounter for screening for infections with a predominantly sexual mode of transmission: Secondary | ICD-10-CM | POA: Diagnosis not present

## 2021-02-15 MED ORDER — IBUPROFEN 800 MG PO TABS
800.0000 mg | ORAL_TABLET | Freq: Three times a day (TID) | ORAL | 5 refills | Status: DC | PRN
Start: 2021-02-15 — End: 2021-03-20

## 2021-02-15 MED ORDER — NORETHIN ACE-ETH ESTRAD-FE 1-20 MG-MCG(24) PO TABS: 1.0000 | ORAL_TABLET | Freq: Every day | ORAL | 11 refills | Status: DC

## 2021-02-15 NOTE — Progress Notes (Signed)
Subjective:        Danielle Robertson is a 20 y.o. female here for a routine exam.  Current complaints: Vaginal discharge.    Personal health questionnaire:  Is patient Ashkenazi Jewish, have a family history of breast and/or ovarian cancer: no Is there a family history of uterine cancer diagnosed at age < 105, gastrointestinal cancer, urinary tract cancer, family member who is a Personnel officer syndrome-associated carrier: no Is the patient overweight and hypertensive, family history of diabetes, personal history of gestational diabetes, preeclampsia or PCOS: no Is patient over 72, have PCOS,  family history of premature CHD under age 38, diabetes, smoke, have hypertension or peripheral artery disease:  no At any time, has a partner hit, kicked or otherwise hurt or frightened you?: no Over the past 2 weeks, have you felt down, depressed or hopeless?: no Over the past 2 weeks, have you felt little interest or pleasure in doing things?:no   Gynecologic History Patient's last menstrual period was 02/08/2021. Contraception: condoms Last Pap: n/a. Results were: n/a Last mammogram: n/a. Results were: n/a  Obstetric History OB History  Gravida Para Term Preterm AB Living  0 0 0 0 0 0  SAB IAB Ectopic Multiple Live Births  0 0 0 0 0    History reviewed. No pertinent past medical history.  History reviewed. No pertinent surgical history.   Current Outpatient Medications:  .  ibuprofen (ADVIL) 800 MG tablet, Take 1 tablet (800 mg total) by mouth every 8 (eight) hours as needed., Disp: 30 tablet, Rfl: 5 .  Norethindrone Acetate-Ethinyl Estrad-FE (LOESTRIN 24 FE) 1-20 MG-MCG(24) tablet, Take 1 tablet by mouth daily., Disp: 28 tablet, Rfl: 11 .  fluticasone (FLONASE) 50 MCG/ACT nasal spray, Place 2 sprays into both nostrils daily., Disp: 1 g, Rfl: 0 No Known Allergies  Social History   Tobacco Use  . Smoking status: Never Smoker  . Smokeless tobacco: Never Used  Substance Use Topics  . Alcohol  use: Yes    Comment: occ    Family History  Problem Relation Age of Onset  . Diabetes Maternal Grandmother   . Diabetes Maternal Grandfather   . COPD Paternal Grandmother        Smoker       Review of Systems  Constitutional: negative for fatigue and weight loss Respiratory: negative for cough and wheezing Cardiovascular: negative for chest pain, fatigue and palpitations Gastrointestinal: negative for abdominal pain and change in bowel habits Musculoskeletal:negative for myalgias Neurological: negative for gait problems and tremors Behavioral/Psych: negative for abusive relationship, depression Endocrine: negative for temperature intolerance    Genitourinary:negative for abnormal menstrual periods, genital lesions, hot flashes, sexual problems.  Positive for vaginal discharge Integument/breast: negative for breast lump, breast tenderness, nipple discharge and skin lesion(s)    Objective:       BP 124/84   Pulse 85   Ht 5' 7.5" (1.715 m)   Wt 121 lb (54.9 kg)   LMP 02/08/2021   BMI 18.67 kg/m  General:   alert and no distress  Skin:   no rash or abnormalities  Lungs:   clear to auscultation bilaterally  Heart:   regular rate and rhythm, S1, S2 normal, no murmur, click, rub or gallop  Breasts:   normal without suspicious masses, skin or nipple changes or axillary nodes  Abdomen:  normal findings: no organomegaly, soft, non-tender and no hernia  Pelvis:  External genitalia: normal general appearance Urinary system: urethral meatus normal and bladder without fullness, nontender Vaginal:  normal without tenderness, induration or masses Cervix: normal appearance Adnexa: normal bimanual exam Uterus: anteverted and non-tender, normal size   Lab Review Urine pregnancy test Labs reviewed yes Radiologic studies reviewed no  I have spent a total of 20 minutes of face-to-face time, excluding clinical staff time, reviewing notes and preparing to see patient, ordering tests  and/or medications, and counseling the patient.   Assessment:     1. Encounter for gynecological examination with Papanicolaou smear of cervix Rx: - Cytology - PAP( [name])  2. Dysmenorrhea Rx: - ibuprofen (ADVIL) 800 MG tablet; Take 1 tablet (800 mg total) by mouth every 8 (eight) hours as needed.  Dispense: 30 tablet; Refill: 5  3. Vaginal discharge Rx: - Cervicovaginal ancillary only( )  4. Screening for STD (sexually transmitted disease) Rx: - RPR+HBsAg+HCVAb+...  5. Encounter for counseling regarding contraception - wants OCP's  6. Encounter for initial prescription of contraceptive pills Rx: - Norethindrone Acetate-Ethinyl Estrad-FE (LOESTRIN 24 FE) 1-20 MG-MCG(24) tablet; Take 1 tablet by mouth daily.  Dispense: 28 tablet; Refill: 11 - continue the use of condoms for STD prevention    Plan:    Education reviewed: calcium supplements, depression evaluation, low fat, low cholesterol diet, safe sex/STD prevention, self breast exams and weight bearing exercise. Contraception: OCP (estrogen/progesterone). Follow up in: 1 year.   Meds ordered this encounter  Medications  . Norethindrone Acetate-Ethinyl Estrad-FE (LOESTRIN 24 FE) 1-20 MG-MCG(24) tablet    Sig: Take 1 tablet by mouth daily.    Dispense:  28 tablet    Refill:  11  . ibuprofen (ADVIL) 800 MG tablet    Sig: Take 1 tablet (800 mg total) by mouth every 8 (eight) hours as needed.    Dispense:  30 tablet    Refill:  5   Orders Placed This Encounter  Procedures  . RPR+HBsAg+HCVAb+...    Brock Bad, MD 02/15/2021 3:15 PM

## 2021-02-15 NOTE — Progress Notes (Signed)
Pt is here for annual and discuss birth control options. Pt states LMP started last week.  Pt states no intercourse since March 6th.

## 2021-02-16 LAB — CERVICOVAGINAL ANCILLARY ONLY
Bacterial Vaginitis (gardnerella): NEGATIVE
Candida Glabrata: NEGATIVE
Candida Vaginitis: NEGATIVE
Chlamydia: NEGATIVE
Comment: NEGATIVE
Comment: NEGATIVE
Comment: NEGATIVE
Comment: NEGATIVE
Comment: NEGATIVE
Comment: NORMAL
Neisseria Gonorrhea: NEGATIVE
Trichomonas: NEGATIVE

## 2021-02-16 LAB — CYTOLOGY - PAP: Diagnosis: NEGATIVE

## 2021-02-16 LAB — RPR+HBSAG+HCVAB+...
HIV Screen 4th Generation wRfx: NONREACTIVE
Hep C Virus Ab: <0.1 s/co ratio (ref 0.0–0.9)
Hepatitis B Surface Ag: NEGATIVE
RPR Ser Ql: NONREACTIVE

## 2021-03-20 ENCOUNTER — Telehealth (INDEPENDENT_AMBULATORY_CARE_PROVIDER_SITE_OTHER): Payer: Medicaid Other | Admitting: Obstetrics

## 2021-03-20 ENCOUNTER — Encounter: Payer: Self-pay | Admitting: Obstetrics

## 2021-03-20 ENCOUNTER — Other Ambulatory Visit: Payer: Self-pay

## 2021-03-20 DIAGNOSIS — Z3009 Encounter for other general counseling and advice on contraception: Secondary | ICD-10-CM

## 2021-03-20 DIAGNOSIS — N946 Dysmenorrhea, unspecified: Secondary | ICD-10-CM | POA: Diagnosis not present

## 2021-03-20 MED ORDER — NORGESTIMATE-ETH ESTRADIOL 0.25-35 MG-MCG PO TABS: 1.0000 | ORAL_TABLET | Freq: Every day | ORAL | 11 refills | Status: DC

## 2021-03-20 MED ORDER — IBUPROFEN 800 MG PO TABS: 800.0000 mg | ORAL_TABLET | Freq: Three times a day (TID) | ORAL | 5 refills | Status: DC | PRN

## 2021-03-20 NOTE — Progress Notes (Signed)
    GYNECOLOGY VIRTUAL VISIT ENCOUNTER NOTE  Provider location: [facility]   Patient location: Home  I connected with Danielle Robertson on 03/20/21 at  8:45 AM EDT by MyChart Video Encounter and verified that I am speaking with the correct person using two identifiers.   I discussed the limitations, risks, security and privacy concerns of performing an evaluation and management service virtually and the availability of in person appointments. I also discussed with the patient that there may be a patient responsible charge related to this service. The patient expressed understanding and agreed to proceed.   History:  Danielle Robertson is a 20 y.o. G0P0000 female being evaluated today for contraception management. She denies any abnormal vaginal discharge, pelvic pain or other concerns.  She states that her periods have been prolonged with LoLoestrin, so she stopped taking it.     History reviewed. No pertinent past medical history. History reviewed. No pertinent surgical history. The following portions of the patient's history were reviewed and updated as appropriate: allergies, current medications, past family history, past medical history, past social history, past surgical history and problem list.   Health Maintenance:  Normal pap and negative HRHPV on 02-15-2021.    Review of Systems:  Pertinent items noted in HPI and remainder of comprehensive ROS otherwise negative.  Physical Exam:   General:  Alert, oriented and cooperative. Patient appears to be in no acute distress.  Mental Status: Normal mood and affect. Normal behavior. Normal judgment and thought content.   Respiratory: Normal respiratory effort, no problems with respiration noted  Rest of physical exam deferred due to type of encounter  Labs and Imaging No results found for this or any previous visit (from the past 336 hour(s)). No results found.     Assessment and Plan:     1. Encounter for  counseling regarding  contraception - recommended trying a different OCP and she agreed  Rx: - norgestimate-ethinyl estradiol (ORTHO-CYCLEN) 0.25-35 MG-MCG tablet; Take 1 tablet by mouth daily.  Dispense: 28 tablet; Refill: 11  2. Dysmenorrhea - recommended taking Ibuprofen on schedule every 8 hours during menses Rx: - ibuprofen (ADVIL) 800 MG tablet; Take 1 tablet (800 mg total) by mouth every 8 (eight) hours as needed.  Dispense: 30 tablet; Refill: 5       I discussed the assessment and treatment plan with the patient. The patient was provided an opportunity to ask questions and all were answered. The patient agreed with the plan and demonstrated an understanding of the instructions.   The patient was advised to call back or seek an in-person evaluation/go to the ED if the symptoms worsen or if the condition fails to improve as anticipated.  I have spent a total of 10 minutes of non-face-to-face time, excluding clinical staff time, reviewing notes and preparing to talk to the patient virtually , ordering tests and/or medications, and counseling the patient.   Coral Ceo, MD [facility], [facility] 03/20/21

## 2021-05-03 ENCOUNTER — Ambulatory Visit
Admission: EM | Admit: 2021-05-03 | Discharge: 2021-05-03 | Disposition: A | Discharge status: Home / Self Care | Payer: Medicaid Other | Attending: Family Medicine | Admitting: Family Medicine

## 2021-05-03 ENCOUNTER — Other Ambulatory Visit: Payer: Self-pay

## 2021-05-03 DIAGNOSIS — R350 Frequency of micturition: Secondary | ICD-10-CM | POA: Insufficient documentation

## 2021-05-03 DIAGNOSIS — N644 Mastodynia: Secondary | ICD-10-CM | POA: Diagnosis present

## 2021-05-03 LAB — POCT URINALYSIS DIP (MANUAL ENTRY)
Bilirubin, UA: NEGATIVE
Blood, UA: NEGATIVE
Glucose, UA: NEGATIVE mg/dL
Ketones, POC UA: NEGATIVE mg/dL
Leukocytes, UA: NEGATIVE
Nitrite, UA: NEGATIVE
Protein Ur, POC: NEGATIVE mg/dL
Spec Grav, UA: 1.02 (ref 1.010–1.025)
Urobilinogen, UA: 0.2 E.U./dL
pH, UA: 7 (ref 5.0–8.0)

## 2021-05-03 LAB — POCT URINE PREGNANCY: Preg Test, Ur: NEGATIVE

## 2021-05-03 NOTE — ED Provider Notes (Signed)
[facility]    CSN: [id number] Arrival date & time: 05/03/21  1248      History   Chief Complaint Chief Complaint  Patient presents with   Possible Pregnancy   STD testing    HPI Lynnelle Mesmer is a 20 y.o. female.   Patient presenting today with several day history of urinary frequency, mild breast tenderness bilaterally.  She also states that her period is several days late and wanting to rule out pregnancy.  She states she took a Plan B several weeks ago and restarted birth control around the same time.  She has 1 week left on her pill pack prior to the placebo week where she should have her period.  Denies vaginal irritation, discharge, itching, pelvic or abdominal pain, flank pain, fevers, chills, concern for STIs.  Has not tried anything over-the-counter for symptoms thus far.   History reviewed. No pertinent past medical history.  There are no problems to display for this patient.   History reviewed. No pertinent surgical history.  OB History     Gravida  0   Para  0   Term  0   Preterm  0   AB  0   Living  0      SAB  0   IAB  0   Ectopic  0   Multiple  0   Live Births  0            Home Medications    Prior to Admission medications   Medication Sig Start Date End Date Taking? Authorizing Provider  fluticasone (FLONASE) 50 MCG/ACT nasal spray Place 2 sprays into both nostrils daily. 12/04/19   Cathie Hoops, Amy V, PA-C  ibuprofen (ADVIL) 800 MG tablet Take 1 tablet (800 mg total) by mouth every 8 (eight) hours as needed. 03/20/21   Brock Bad, MD  norgestimate-ethinyl estradiol (ORTHO-CYCLEN) 0.25-35 MG-MCG tablet Take 1 tablet by mouth daily. 03/20/21   Brock Bad, MD  albuterol (VENTOLIN HFA) 108 (90 Base) MCG/ACT inhaler Inhale 1-2 puffs into the lungs every 6 (six) hours as needed for wheezing or shortness of breath. 12/04/19 05/17/20  Cathie Hoops, Amy V, PA-C  Norethindrone-Ethinyl Estradiol-Fe Biphas (LO LOESTRIN FE) 1 MG-10 MCG /  10 MCG tablet Take 1 tablet by mouth daily. 11/08/19 05/17/20  Leftwich-Kirby, Wilmer Floor, CNM    Family History Family History  Problem Relation Age of Onset   Diabetes Maternal Grandmother    Diabetes Maternal Grandfather    COPD Paternal Grandmother        Smoker     Social History Social History   Tobacco Use   Smoking status: Never   Smokeless tobacco: Never  Vaping Use   Vaping Use: Never used  Substance Use Topics   Alcohol use: Yes    Comment: occ   Drug use: No     Allergies   Patient has no known allergies.   Review of Systems Review of Systems Per HPI  Physical Exam Triage Vital Signs ED Triage Vitals  Enc Vitals Group     BP 05/03/21 1256 123/82     Pulse Rate 05/03/21 1256 89     Resp 05/03/21 1256 18     Temp 05/03/21 1256 98.1 F (36.7 C)     Temp Source 05/03/21 1256 Oral     SpO2 05/03/21 1256 98 %     Weight --      Height --      Head Circumference --  Peak Flow --      Pain Score 05/03/21 1300 0     Pain Loc --      Pain Edu? --      Excl. in GC? --    No data found.  Updated Vital Signs BP 123/82 (BP Location: Left Arm)   Pulse 89   Temp 98.1 F (36.7 C) (Oral)   Resp 18   LMP 04/05/2021 (Exact Date)   SpO2 98%   Visual Acuity Right Eye Distance:   Left Eye Distance:   Bilateral Distance:    Right Eye Near:   Left Eye Near:    Bilateral Near:     Physical Exam Vitals and nursing note reviewed.  Constitutional:      Appearance: Normal appearance. She is not ill-appearing.  HENT:     Head: Atraumatic.     Mouth/Throat:     Mouth: Mucous membranes are moist.     Pharynx: Oropharynx is clear.  Eyes:     Extraocular Movements: Extraocular movements intact.     Conjunctiva/sclera: Conjunctivae normal.  Cardiovascular:     Rate and Rhythm: Normal rate and regular rhythm.     Heart sounds: Normal heart sounds.  Pulmonary:     Effort: Pulmonary effort is normal.     Breath sounds: Normal breath sounds.  Abdominal:      General: Bowel sounds are normal. There is no distension.     Palpations: Abdomen is soft.     Tenderness: There is no abdominal tenderness. There is no right CVA tenderness, left CVA tenderness or guarding.  Genitourinary:    Comments: GU exam deferred, self swab performed Musculoskeletal:        General: No swelling or tenderness. Normal range of motion.     Cervical back: Normal range of motion and neck supple.  Skin:    General: Skin is warm and dry.  Neurological:     Mental Status: She is alert and oriented to person, place, and time.  Psychiatric:        Mood and Affect: Mood normal.        Thought Content: Thought content normal.        Judgment: Judgment normal.     UC Treatments / Results  Labs (all labs ordered are listed, but only abnormal results are displayed) Labs Reviewed  POCT URINE PREGNANCY  POCT URINALYSIS DIP (MANUAL ENTRY)  CERVICOVAGINAL ANCILLARY ONLY    EKG   Radiology No results found.  Procedures Procedures (including critical care time)  Medications Ordered in UC Medications - No data to display  Initial Impression / Assessment and Plan / UC Course  I have reviewed the triage vital signs and the nursing notes.  Pertinent labs & imaging results that were available during my care of the patient were reviewed by me and considered in my medical decision making (see chart for details).     Exam and vitals very reassuring, suspect symptoms related to her body adjusting to her new birth control pill.  Based on the timing of her pill pack, is not due for her period just yet and discussed this with patient.  Urine pregnancy negative, UA without evidence of UTI.  Vaginal swab pending, will treat if needed based on these results.  Discussed abstinence until these results return and safe sexual practices.  Follow-up if symptoms acutely worsening in the meantime.  Final Clinical Impressions(s) / UC Diagnoses   Final diagnoses:  Urinary frequency   Breast tenderness in  female   Discharge Instructions   None    ED Prescriptions   None    PDMP not reviewed this encounter.   Particia Nearing, [place] 05/03/21 1350

## 2021-05-03 NOTE — ED Triage Notes (Signed)
MP is 4 days late. Pt started taking birth control pills on 5/26. Pt reports that she took a Plan B pill 5/27 and would like to r/o pregnancy.  No cramps or spotting. Denies vaginal discharge and odor. Confirms some urinary frequency and urgency for 2 days. Request for STD testing.

## 2021-05-04 LAB — CERVICOVAGINAL ANCILLARY ONLY
Bacterial Vaginitis (gardnerella): NEGATIVE
Candida Glabrata: NEGATIVE
Candida Vaginitis: NEGATIVE
Chlamydia: NEGATIVE
Comment: NEGATIVE
Comment: NEGATIVE
Comment: NEGATIVE
Comment: NEGATIVE
Comment: NEGATIVE
Comment: NORMAL
Neisseria Gonorrhea: NEGATIVE
Trichomonas: NEGATIVE

## 2022-01-23 ENCOUNTER — Ambulatory Visit: Payer: Medicaid Other | Admitting: Obstetrics

## 2022-01-23 ENCOUNTER — Other Ambulatory Visit: Payer: Self-pay

## 2022-01-23 ENCOUNTER — Encounter: Payer: Self-pay | Admitting: Obstetrics

## 2022-01-23 VITALS — BP 124/69 | HR 97 | Ht 68.0 in | Wt 126.0 lb

## 2022-01-23 DIAGNOSIS — N946 Dysmenorrhea, unspecified: Secondary | ICD-10-CM | POA: Diagnosis not present

## 2022-01-23 DIAGNOSIS — Z30013 Encounter for initial prescription of injectable contraceptive: Secondary | ICD-10-CM | POA: Diagnosis not present

## 2022-01-23 DIAGNOSIS — E569 Vitamin deficiency, unspecified: Secondary | ICD-10-CM

## 2022-01-23 DIAGNOSIS — Z3009 Encounter for other general counseling and advice on contraception: Secondary | ICD-10-CM | POA: Diagnosis not present

## 2022-01-23 LAB — POCT URINE PREGNANCY: Preg Test, Ur: NEGATIVE

## 2022-01-23 MED ORDER — VITAFOL ULTRA 29-0.6-0.4-200 MG PO CAPS: 1.0000 | ORAL_CAPSULE | Freq: Every day | ORAL | 4 refills | Status: DC

## 2022-01-23 MED ORDER — MEDROXYPROGESTERONE ACETATE 150 MG/ML IM SUSP
150.0000 mg | Freq: Once | INTRAMUSCULAR | Status: AC
  Administered 2022-01-23: 150 mg via INTRAMUSCULAR

## 2022-01-23 MED ORDER — IBUPROFEN 800 MG PO TABS: 800.0000 mg | ORAL_TABLET | Freq: Three times a day (TID) | ORAL | 5 refills | Status: DC | PRN

## 2022-01-23 MED ORDER — MEDROXYPROGESTERONE ACETATE 150 MG/ML IM SUSP: 150.0000 mg | INTRAMUSCULAR | 4 refills | Status: DC

## 2022-01-23 NOTE — Progress Notes (Signed)
Subjective:  ? ? Danielle Robertson is a 21 y.o. female who presents for contraception counseling.  She wants to start OCP's. The patient has no complaints today. The patient is sexually active. Pertinent past medical history: none. ? ?The information documented in the HPI was reviewed and verified. ? ?Menstrual History: ?OB History   ? ? Gravida  ?0  ? Para  ?0  ? Term  ?0  ? Preterm  ?0  ? AB  ?0  ? Living  ?0  ?  ? ? SAB  ?0  ? IAB  ?0  ? Ectopic  ?0  ? Multiple  ?0  ? Live Births  ?0  ?   ?  ?  ?  ? ?Patient's last menstrual period was 12/27/2021 (exact date). ?  ?There are no problems to display for this patient. ? ?No past medical history on file.  ?No past surgical history on file.  ? ?Current Outpatient Medications:  ?  medroxyPROGESTERone (DEPO-PROVERA) 150 MG/ML injection, Inject 1 mL (150 mg total) into the muscle every 3 (three) months., Disp: 1 mL, Rfl: 4 ?  fluticasone (FLONASE) 50 MCG/ACT nasal spray, Place 2 sprays into both nostrils daily., Disp: 1 g, Rfl: 0 ?  ibuprofen (ADVIL) 800 MG tablet, Take 1 tablet (800 mg total) by mouth every 8 (eight) hours as needed., Disp: 30 tablet, Rfl: 5 ?  Prenat-Fe Poly-Methfol-FA-DHA (VITAFOL ULTRA) 29-0.6-0.4-200 MG CAPS, Take 1 capsule by mouth daily before breakfast., Disp: 90 capsule, Rfl: 4 ?No Known Allergies  ?Social History  ? ?Tobacco Use  ? Smoking status: Never  ? Smokeless tobacco: Never  ?Substance Use Topics  ? Alcohol use: Yes  ?  Comment: occ  ?  ?Family History  ?Problem Relation Age of Onset  ? Diabetes Maternal Grandmother   ? Diabetes Maternal Grandfather   ? COPD Paternal Grandmother   ?     Smoker   ?  ? ? ? ?Review of Systems ?Constitutional: negative for weight loss ?Genitourinary:negative for abnormal menstrual periods and vaginal discharge ? ? ?Objective:  ? ?BP 124/69   Pulse 97   Ht 5\' 8"  (1.727 m)   Wt 126 lb (57.2 kg)   LMP 12/27/2021 (Exact Date)   BMI 19.16 kg/m?  ?  ?General:   Alert and no distress  ?Skin:   no rash or  abnormalities  ?Lungs:   clear to auscultation bilaterally  ?Heart:   regular rate and rhythm, S1, S2 normal, no murmur, click, rub or gallop  ?The remainder of the physical exam deferred due to the t ype of encounter. ? ?Lab Review ?Urine pregnancy test ?Labs reviewed yes ?Radiologic studies reviewed no ? ?I have spent a total of 15 minutes of face-to-face time, excluding clinical staff time, reviewing notes and preparing to see patient, ordering tests and/or medications, and counseling the patient.  ? ?Assessment:  ? ? 21 y.o., starting Depo-Provera injections, no contraindications.  ? ?Plan:  ? ?1. Encounter for other general counseling and advice on contraception ?- options discussed.  Wants Depo Provera injections ? ?2. Encounter for initial prescription of injectable contraceptive ?Rx: ?- medroxyPROGESTERone (DEPO-PROVERA) 150 MG/ML injection; Inject 1 mL (150 mg total) into the muscle every 3 (three) months.  Dispense: 1 mL; Refill: 4 ?- medroxyPROGESTERone (DEPO-PROVERA) injection 150 mg ?- POCT urine pregnancy ? ?3. Dysmenorrhea ?Rx: ?- ibuprofen (ADVIL) 800 MG tablet; Take 1 tablet (800 mg total) by mouth every 8 (eight) hours as needed.  Dispense: 30 tablet; Refill:  5 ? ?4. Vitamin deficiency ?Rx: ?- Prenat-Fe Poly-Methfol-FA-DHA (VITAFOL ULTRA) 29-0.6-0.4-200 MG CAPS; Take 1 capsule by mouth daily before breakfast.  Dispense: 90 capsule; Refill: 4  ? ? ? All questions answered. ?Discussed healthy lifestyle modifications. ?Follow up in 3 months. ? ?Meds ordered this encounter  ?Medications  ? medroxyPROGESTERone (DEPO-PROVERA) 150 MG/ML injection  ?  Sig: Inject 1 mL (150 mg total) into the muscle every 3 (three) months.  ?  Dispense:  1 mL  ?  Refill:  4  ? medroxyPROGESTERone (DEPO-PROVERA) injection 150 mg  ? Prenat-Fe Poly-Methfol-FA-DHA (VITAFOL ULTRA) 29-0.6-0.4-200 MG CAPS  ?  Sig: Take 1 capsule by mouth daily before breakfast.  ?  Dispense:  90 capsule  ?  Refill:  4  ? ibuprofen (ADVIL) 800 MG  tablet  ?  Sig: Take 1 tablet (800 mg total) by mouth every 8 (eight) hours as needed.  ?  Dispense:  30 tablet  ?  Refill:  5  ? ?Orders Placed This Encounter  ?Procedures  ? POCT urine pregnancy  ? ? ?Brock Bad, MD ?01/23/2022 2:58 PM  ?

## 2022-01-23 NOTE — Progress Notes (Addendum)
21 y.o presents for [facility] Consult, she is currently on OCP and wants to switch to DEPO Injections.  Last took her OCP  1.5-2 weeks ago. ? ?UPT today is NEGATIVE ? ?Administrations This Visit   ? ? medroxyPROGESTERone (DEPO-PROVERA) injection 150 mg   ? ? Admin Date ?01/23/2022 Action ?Given Dose ?150 mg Route ?Intramuscular Administered By ?Tamela Oddi, RMA  ? ?  ?  ? ?  ? DEPO Injection given in LUOQ, tolerated well. ? ?Next DEPO Injection due May 17-31, 2023 ? ?

## 2022-04-15 ENCOUNTER — Ambulatory Visit: Payer: Medicaid Other | Admitting: Obstetrics and Gynecology

## 2022-05-02 ENCOUNTER — Ambulatory Visit (INDEPENDENT_AMBULATORY_CARE_PROVIDER_SITE_OTHER): Payer: Medicaid Other

## 2022-05-02 VITALS — BP 123/82 | Wt 128.8 lb

## 2022-05-02 DIAGNOSIS — Z3042 Encounter for surveillance of injectable contraceptive: Secondary | ICD-10-CM | POA: Diagnosis not present

## 2022-05-02 MED ORDER — MEDROXYPROGESTERONE ACETATE 150 MG/ML IM SUSP
150.0000 mg | Freq: Once | INTRAMUSCULAR | Status: AC
  Administered 2022-05-02: 150 mg via INTRAMUSCULAR

## 2022-05-02 NOTE — Progress Notes (Addendum)
Subjective:  Pt in for Depo Provera injection. Used pt's supply.  Objective: Need for contraception. No unusual complaints.    Assessment: Depo given R upper outer quadrant. Pt tolerated Depo injection.   Plan:  Next injection scheduled 07/29/22.

## 2022-05-17 ENCOUNTER — Other Ambulatory Visit (HOSPITAL_COMMUNITY)
Admission: RE | Admit: 2022-05-17 | Discharge: 2022-05-17 | Disposition: A | Discharge status: Home / Self Care | Payer: Medicaid Other | Source: Ambulatory Visit | Attending: Obstetrics and Gynecology | Admitting: Obstetrics and Gynecology

## 2022-05-17 ENCOUNTER — Encounter: Payer: Self-pay | Admitting: Obstetrics and Gynecology

## 2022-05-17 ENCOUNTER — Ambulatory Visit (INDEPENDENT_AMBULATORY_CARE_PROVIDER_SITE_OTHER): Payer: Medicaid Other | Admitting: Obstetrics and Gynecology

## 2022-05-17 VITALS — BP 114/78 | HR 98 | Ht 68.0 in | Wt 131.0 lb

## 2022-05-17 DIAGNOSIS — Z113 Encounter for screening for infections with a predominantly sexual mode of transmission: Secondary | ICD-10-CM

## 2022-05-17 DIAGNOSIS — N921 Excessive and frequent menstruation with irregular cycle: Secondary | ICD-10-CM | POA: Diagnosis not present

## 2022-05-17 DIAGNOSIS — Z01419 Encounter for gynecological examination (general) (routine) without abnormal findings: Secondary | ICD-10-CM

## 2022-05-17 HISTORY — DX: Excessive and frequent menstruation with irregular cycle: N92.1

## 2022-05-17 NOTE — Progress Notes (Signed)
Pt presents for annual exam. Last PAP 01-2021. Pt requesting STI testing. Pt has concerns about irregular bleeding with Depo.

## 2022-05-18 LAB — RPR: RPR Ser Ql: NONREACTIVE

## 2022-05-18 LAB — HEPATITIS C ANTIBODY: Hep C Virus Ab: NONREACTIVE

## 2022-05-18 LAB — HIV ANTIBODY (ROUTINE TESTING W REFLEX): HIV Screen 4th Generation wRfx: NONREACTIVE

## 2022-05-18 LAB — HEPATITIS B SURFACE ANTIGEN: Hepatitis B Surface Ag: NEGATIVE

## 2022-05-20 LAB — CERVICOVAGINAL ANCILLARY ONLY
Bacterial Vaginitis (gardnerella): NEGATIVE
Candida Glabrata: NEGATIVE
Candida Vaginitis: NEGATIVE
Chlamydia: NEGATIVE
Comment: NEGATIVE
Comment: NEGATIVE
Comment: NEGATIVE
Comment: NEGATIVE
Comment: NEGATIVE
Comment: NORMAL
Neisseria Gonorrhea: NEGATIVE
Trichomonas: NEGATIVE

## 2022-06-04 ENCOUNTER — Emergency Department (HOSPITAL_COMMUNITY)
Admission: EM | Admit: 2022-06-04 | Discharge: 2022-06-04 | Discharge status: Against Medical Advice | Payer: Medicaid Other | Source: Home / Self Care

## 2022-07-29 ENCOUNTER — Ambulatory Visit: Payer: Medicaid Other

## 2022-07-30 ENCOUNTER — Ambulatory Visit (INDEPENDENT_AMBULATORY_CARE_PROVIDER_SITE_OTHER): Payer: Medicaid Other | Admitting: General Practice

## 2022-07-30 DIAGNOSIS — Z30011 Encounter for initial prescription of contraceptive pills: Secondary | ICD-10-CM

## 2022-07-30 MED ORDER — NORETHIN ACE-ETH ESTRAD-FE 1-20 MG-MCG(24) PO TABS: 1.0000 | ORAL_TABLET | Freq: Every day | ORAL | 11 refills | Status: DC

## 2022-07-30 NOTE — Progress Notes (Signed)
Patient was assessed and managed by nursing staff during this encounter. I have reviewed the chart and agree with the documentation and plan. I have also made any necessary editorial changes.  Warden Fillers, MD 07/30/2022 4:29 PM

## 2022-07-30 NOTE — Progress Notes (Signed)
Pt presents for contraceptive management. Pt wanting to switch from Depo to OCP. Last Depo 05/02/22. Discussed switch with Dr. Donavan Foil who approved Rx for Lo Estrin Fe 24. F/u in 1 month to see provider due to elevated BP today.

## 2022-09-02 ENCOUNTER — Ambulatory Visit: Payer: Medicaid Other | Admitting: Obstetrics and Gynecology

## 2022-09-09 ENCOUNTER — Encounter: Payer: Self-pay | Admitting: Obstetrics and Gynecology

## 2022-09-09 ENCOUNTER — Ambulatory Visit (INDEPENDENT_AMBULATORY_CARE_PROVIDER_SITE_OTHER): Payer: Medicaid Other | Admitting: Obstetrics and Gynecology

## 2022-09-09 VITALS — BP 134/74 | HR 100 | Wt 135.8 lb

## 2022-09-09 DIAGNOSIS — Z3009 Encounter for other general counseling and advice on contraception: Secondary | ICD-10-CM

## 2022-09-09 DIAGNOSIS — N946 Dysmenorrhea, unspecified: Secondary | ICD-10-CM | POA: Diagnosis not present

## 2022-09-09 DIAGNOSIS — Z3041 Encounter for surveillance of contraceptive pills: Secondary | ICD-10-CM | POA: Diagnosis not present

## 2022-09-09 NOTE — Progress Notes (Signed)
Pt is in the office for follow up to discus BC and elevation of BP in the last few months after starting depo. Pt stopped depo in September and switched to [facility] pills. Denies abnormal symptoms LMP 08/29/22

## 2022-09-09 NOTE — Progress Notes (Signed)
   CLINIC ENCOUNTER NOTE  History:  21 y.o. G0P0000 here today for blood pressure check and while on OCPs.  Patient previously been on Depo-Provera and having breakthrough bleeding and so switched to birth control pills.  She is no longer having breakthrough bleeding with OCPs.  Patient does to continue does continue to have cramping with OCPs currently taking ibuprofen with relief.  Patient denies known family history of high blood pressure, DM in both grandparents.  Patient denies contraindications to combination hormonal therapy, she does note occasional/rare vape use but otherwise does not smoke.  She does not have access to a BP cuff at home.  No past medical history on file.  No past surgical history on file.  The following portions of the patient's history were reviewed and updated as appropriate: allergies, current medications, past family history, past medical history, past social history, past surgical history and problem list.    Review of Systems:  Pertinent items are noted in HPI. Comprehensive review of systems was otherwise negative.   Objective:  Physical Exam BP 134/74   Pulse 100   Wt 135 lb 12.8 oz (61.6 kg)   LMP 08/29/2022   BMI 20.96 kg/m    Physical Exam Vitals and nursing note reviewed.  Constitutional:      Appearance: Normal appearance.  HENT:     Head: Normocephalic and atraumatic.  Cardiovascular:     Rate and Rhythm: Normal rate and regular rhythm.  Pulmonary:     Effort: Pulmonary effort is normal.     Breath sounds: Normal breath sounds.  Skin:    General: Skin is warm and dry.  Neurological:     General: No focal deficit present.     Mental Status: She is alert.  Psychiatric:        Mood and Affect: Mood normal.        Behavior: Behavior normal.        Thought Content: Thought content normal.        Judgment: Judgment normal.    Labs and Imaging No results found.     Assessment & Plan:   Routine preventative health maintenance  measures emphasized.  1. Encounter for counseling regarding contraception Discussed methods for contraceptive use and continued OCP use at this time.  Reviewed that blood pressure could be related to family history or current use of OCPs.  Discussed warning signs of elevated high blood pressure crisis.  Blood pressure today is borderline elevated. We will have blood pressure recheck in 2 weeks with nurse, if elevated will alert provider at the time.  Otherwise we will plan for follow-up in 3 to 4 months to check in on dysmenorrhea and blood pressure control with OCPs on board  2. Dysmenorrhea Currently using OCPs to control menstrual cramping, with moderate control in addition to NSAID use.  We will continue to monitor symptoms on OCPs, if no relief will consider transition to other pill or other contraceptive method.  Can continue NSAID use in the interim   Darliss Cheney, MD [facility] [place]

## 2022-09-27 ENCOUNTER — Encounter (HOSPITAL_COMMUNITY): Payer: Self-pay

## 2022-09-27 ENCOUNTER — Ambulatory Visit (HOSPITAL_COMMUNITY)
Admission: RE | Admit: 2022-09-27 | Discharge: 2022-09-27 | Disposition: A | Discharge status: Home / Self Care | Payer: Medicaid Other | Source: Ambulatory Visit | Attending: Physician Assistant | Admitting: Physician Assistant

## 2022-09-27 VITALS — BP 136/81 | HR 77 | Temp 98.1°F | Resp 16

## 2022-09-27 DIAGNOSIS — N898 Other specified noninflammatory disorders of vagina: Secondary | ICD-10-CM | POA: Diagnosis present

## 2022-09-27 MED ORDER — METRONIDAZOLE 500 MG PO TABS: 500.0000 mg | ORAL_TABLET | Freq: Two times a day (BID) | ORAL | 0 refills | Status: DC

## 2022-09-27 NOTE — ED Triage Notes (Signed)
Pt is here for vaginal itching x1wk

## 2022-09-27 NOTE — ED Provider Notes (Signed)
[facility]    CSN: [id number] Arrival date & time: 09/27/22  1730      History   Chief Complaint Chief Complaint  Patient presents with   Vaginal Itching    I believe I have a yeast infection. - Entered by patient    HPI Danielle Goda is a 21 y.o. female.   Patient here today for evaluation of vaginal discharge and itching she has had for the last week.  She reports that her discharge does have a smell.  Discharge is thin and clear but appears white in underwear.  She has not had any other symptoms.  She does have history of BV.  She denies recent sexual activity.  The history is provided by the patient.  Vaginal Itching    Past Medical History:  Diagnosis Date   Breakthrough bleeding on depo provera 05/17/2022    Patient Active Problem List   Diagnosis Date Noted   Oral contraceptive use 09/09/2022   Dysmenorrhea 09/09/2022    History reviewed. No pertinent surgical history.  OB History     Gravida  0   Para  0   Term  0   Preterm  0   AB  0   Living  0      SAB  0   IAB  0   Ectopic  0   Multiple  0   Live Births  0            Home Medications    Prior to Admission medications   Medication Sig Start Date End Date Taking? Authorizing Provider  metroNIDAZOLE (FLAGYL) 500 MG tablet Take 1 tablet (500 mg total) by mouth 2 (two) times daily. 09/27/22  Yes Francene Finders, PA-C  fluticasone (FLONASE) 50 MCG/ACT nasal spray Place 2 sprays into both nostrils daily. 12/04/19   Tasia Catchings, Amy V, PA-C  ibuprofen (ADVIL) 800 MG tablet Take 1 tablet (800 mg total) by mouth every 8 (eight) hours as needed. Patient not taking: Reported on 09/09/2022 01/23/22   Shelly Bombard, MD  Norethindrone Acetate-Ethinyl Estrad-FE (LOESTRIN 24 FE) 1-20 MG-MCG(24) tablet Take 1 tablet by mouth daily. 07/30/22   Griffin Basil, MD  Prenat-Fe Poly-Methfol-FA-DHA (VITAFOL ULTRA) 29-0.6-0.4-200 MG CAPS Take 1 capsule by mouth daily before breakfast. Patient  not taking: Reported on 05/17/2022 01/23/22   Shelly Bombard, MD  albuterol (VENTOLIN HFA) 108 (90 Base) MCG/ACT inhaler Inhale 1-2 puffs into the lungs every 6 (six) hours as needed for wheezing or shortness of breath. 12/04/19 05/17/20  Tasia Catchings, Amy V, PA-C  Norethindrone-Ethinyl Estradiol-Fe Biphas (LO LOESTRIN FE) 1 MG-10 MCG / 10 MCG tablet Take 1 tablet by mouth daily. 11/08/19 05/17/20  Leftwich-Kirby, Kathie Dike, CNM    Family History Family History  Problem Relation Age of Onset   Diabetes Maternal Grandmother    Diabetes Maternal Grandfather    COPD Paternal Grandmother        Smoker     Social History Social History   Tobacco Use   Smoking status: Never    Passive exposure: Never   Smokeless tobacco: Never  Vaping Use   Vaping Use: Never used  Substance Use Topics   Alcohol use: Yes    Comment: occ   Drug use: No     Allergies   Patient has no known allergies.   Review of Systems Review of Systems  Constitutional:  Negative for chills and fever.  Eyes:  Negative for discharge and redness.  Gastrointestinal:  Negative for nausea and vomiting.  Genitourinary:  Positive for vaginal discharge. Negative for pelvic pain.     Physical Exam Triage Vital Signs ED Triage Vitals  Enc Vitals Group     BP      Pulse      Resp      Temp      Temp src      SpO2      Weight      Height      Head Circumference      Peak Flow      Pain Score      Pain Loc      Pain Edu?      Excl. in [place]?    No data found.  Updated Vital Signs BP 136/81 (BP Location: Left Arm)   Pulse 77   Temp 98.1 F (36.7 C) (Oral)   Resp 16   LMP 08/29/2022   SpO2 98%      Physical Exam Vitals and nursing note reviewed.  Constitutional:      General: She is not in acute distress.    Appearance: Normal appearance. She is not ill-appearing.  HENT:     Head: Normocephalic and atraumatic.  Eyes:     Conjunctiva/sclera: Conjunctivae normal.  Cardiovascular:     Rate and Rhythm: Normal  rate.  Pulmonary:     Effort: Pulmonary effort is normal.  Neurological:     Mental Status: She is alert.  Psychiatric:        Mood and Affect: Mood normal.        Behavior: Behavior normal.        Thought Content: Thought content normal.      UC Treatments / Results  Labs (all labs ordered are listed, but only abnormal results are displayed) Labs Reviewed  CERVICOVAGINAL ANCILLARY ONLY    EKG   Radiology No results found.  Procedures Procedures (including critical care time)  Medications Ordered in UC Medications - No data to display  Initial Impression / Assessment and Plan / UC Course  I have reviewed the triage vital signs and the nursing notes.  Pertinent labs & imaging results that were available during my care of the patient were reviewed by me and considered in my medical decision making (see chart for details).    We will treat to cover BV given history of same in presentation.  Advised to avoid alcohol while taking metronidazole.  STD screening ordered as well as screening for yeast.  Encouraged follow-up with any further concerns.  Final Clinical Impressions(s) / UC Diagnoses   Final diagnoses:  Vaginal discharge   Discharge Instructions   None    ED Prescriptions     Medication Sig Dispense Auth. Provider   metroNIDAZOLE (FLAGYL) 500 MG tablet Take 1 tablet (500 mg total) by mouth 2 (two) times daily. 14 tablet Francene Finders, PA-C      PDMP not reviewed this encounter.   Francene Finders, PA-C 09/27/22 1820

## 2022-09-30 LAB — CERVICOVAGINAL ANCILLARY ONLY
Bacterial Vaginitis (gardnerella): NEGATIVE
Candida Glabrata: NEGATIVE
Candida Vaginitis: POSITIVE — AB
Chlamydia: NEGATIVE
Comment: NEGATIVE
Comment: NEGATIVE
Comment: NEGATIVE
Comment: NEGATIVE
Comment: NEGATIVE
Comment: NORMAL
Neisseria Gonorrhea: NEGATIVE
Trichomonas: NEGATIVE

## 2022-10-01 ENCOUNTER — Telehealth (HOSPITAL_COMMUNITY): Payer: Self-pay | Admitting: Emergency Medicine

## 2022-10-01 MED ORDER — FLUCONAZOLE 150 MG PO TABS: 150.0000 mg | ORAL_TABLET | Freq: Once | ORAL | 0 refills | Status: AC

## 2022-11-05 ENCOUNTER — Encounter: Payer: Self-pay | Admitting: Emergency Medicine

## 2022-11-05 ENCOUNTER — Ambulatory Visit
Admission: EM | Admit: 2022-11-05 | Discharge: 2022-11-05 | Disposition: A | Discharge status: Home / Self Care | Payer: Medicaid Other | Attending: Physician Assistant | Admitting: Physician Assistant

## 2022-11-05 DIAGNOSIS — R051 Acute cough: Secondary | ICD-10-CM | POA: Insufficient documentation

## 2022-11-05 DIAGNOSIS — R6883 Chills (without fever): Secondary | ICD-10-CM | POA: Insufficient documentation

## 2022-11-05 DIAGNOSIS — Z1152 Encounter for screening for COVID-19: Secondary | ICD-10-CM | POA: Insufficient documentation

## 2022-11-05 NOTE — ED Triage Notes (Signed)
Pt is present today with c/o cough, congestion, chills, and body aches x2 days

## 2022-11-05 NOTE — ED Provider Notes (Signed)
[facility]    CSN: [id number] Arrival date & time: 11/05/22  6295      History   Chief Complaint Chief Complaint  Patient presents with   Cough   Chills   Generalized Body Aches    congestion    HPI Danielle Robertson is a 21 y.o. female.   21 year old female presents with cough and congestion.  Patient indicates that she was in close contact with her sister 2 days ago when she found out her sister tested positive for COVID.  Patient desires to be tested and screened for COVID since 2 days ago she started with having cough, congestion, which is mainly clear.  She relates having upper respiratory congestion with rhinitis and postnasal drip which is also clear.  Patient denies having fever but she has had some mild chills along with body aches and discomfort.  Patient is tolerating fluids well.   Cough   Past Medical History:  Diagnosis Date   Breakthrough bleeding on depo provera 05/17/2022    Patient Active Problem List   Diagnosis Date Noted   Oral contraceptive use 09/09/2022   Dysmenorrhea 09/09/2022    History reviewed. No pertinent surgical history.  OB History     Gravida  0   Para  0   Term  0   Preterm  0   AB  0   Living  0      SAB  0   IAB  0   Ectopic  0   Multiple  0   Live Births  0            Home Medications    Prior to Admission medications   Medication Sig Start Date End Date Taking? Authorizing Provider  fluticasone (FLONASE) 50 MCG/ACT nasal spray Place 2 sprays into both nostrils daily. 12/04/19   Cathie Hoops, Amy V, PA-C  ibuprofen (ADVIL) 800 MG tablet Take 1 tablet (800 mg total) by mouth every 8 (eight) hours as needed. Patient not taking: Reported on 09/09/2022 01/23/22   Brock Bad, MD  metroNIDAZOLE (FLAGYL) 500 MG tablet Take 1 tablet (500 mg total) by mouth 2 (two) times daily. 09/27/22   Tomi Bamberger, PA-C  Norethindrone Acetate-Ethinyl Estrad-FE (LOESTRIN 24 FE) 1-20 MG-MCG(24) tablet Take 1  tablet by mouth daily. 07/30/22   Warden Fillers, MD  Prenat-Fe Poly-Methfol-FA-DHA (VITAFOL ULTRA) 29-0.6-0.4-200 MG CAPS Take 1 capsule by mouth daily before breakfast. Patient not taking: Reported on 05/17/2022 01/23/22   Brock Bad, MD  albuterol (VENTOLIN HFA) 108 (90 Base) MCG/ACT inhaler Inhale 1-2 puffs into the lungs every 6 (six) hours as needed for wheezing or shortness of breath. 12/04/19 05/17/20  Cathie Hoops, Amy V, PA-C  Norethindrone-Ethinyl Estradiol-Fe Biphas (LO LOESTRIN FE) 1 MG-10 MCG / 10 MCG tablet Take 1 tablet by mouth daily. 11/08/19 05/17/20  Leftwich-Kirby, Wilmer Floor, CNM    Family History Family History  Problem Relation Age of Onset   Diabetes Maternal Grandmother    Diabetes Maternal Grandfather    COPD Paternal Grandmother        Smoker     Social History Social History   Tobacco Use   Smoking status: Never    Passive exposure: Never   Smokeless tobacco: Never  Vaping Use   Vaping Use: Never used  Substance Use Topics   Alcohol use: Yes    Comment: occ   Drug use: No     Allergies   Patient has no known allergies.  Review of Systems Review of Systems  Respiratory:  Positive for cough.      Physical Exam Triage Vital Signs ED Triage Vitals  Enc Vitals Group     BP 11/05/22 0948 122/87     Pulse Rate 11/05/22 0948 94     Resp 11/05/22 0948 18     Temp 11/05/22 0948 98.1 F (36.7 C)     Temp src --      SpO2 11/05/22 0948 98 %     Weight --      Height --      Head Circumference --      Peak Flow --      Pain Score 11/05/22 0946 0     Pain Loc --      Pain Edu? --      Excl. in [place]? --    No data found.  Updated Vital Signs BP 122/87   Pulse 94   Temp 98.1 F (36.7 C)   Resp 18   SpO2 98%   Visual Acuity Right Eye Distance:   Left Eye Distance:   Bilateral Distance:    Right Eye Near:   Left Eye Near:    Bilateral Near:     Physical Exam Constitutional:      Appearance: Normal appearance.  HENT:     Right Ear:  Tympanic membrane and ear canal normal.     Left Ear: Tympanic membrane and ear canal normal.     Mouth/Throat:     Mouth: Mucous membranes are moist.     Pharynx: Oropharynx is clear. No posterior oropharyngeal erythema.  Cardiovascular:     Rate and Rhythm: Normal rate and regular rhythm.     Heart sounds: Normal heart sounds.  Pulmonary:     Effort: Pulmonary effort is normal.     Breath sounds: Normal breath sounds and air entry. No wheezing, rhonchi or rales.  Lymphadenopathy:     Cervical: No cervical adenopathy.  Neurological:     Mental Status: She is alert.      UC Treatments / Results  Labs (all labs ordered are listed, but only abnormal results are displayed) Labs Reviewed  SARS CORONAVIRUS 2 (TAT 6-24 HRS)    EKG   Radiology No results found.  Procedures Procedures (including critical care time)  Medications Ordered in UC Medications - No data to display  Initial Impression / Assessment and Plan / UC Course  I have reviewed the triage vital signs and the nursing notes.  Pertinent labs & imaging results that were available during my care of the patient were reviewed by me and considered in my medical decision making (see chart for details).    Plan: 1.  The COVID-19 screening will be treated with the following: A.  Treatment will be modified depending on the test results from the COVID test. 2.  The acute cough will be treated with the following: A.  Advised patient to use Mucinex DM for cough and congestion. 3.  The chills will be treated with the following: A.  Advised patient to take ibuprofen or Tylenol for chills, fever or discomfort. 4.  Advised follow-up PCP return to urgent care as needed. Final Clinical Impressions(s) / UC Diagnoses   Final diagnoses:  Encounter for screening for COVID-19  Acute cough  Chills     Discharge Instructions      Completed in 48 hours.  If you do not hear from this office that indicates the test is  negative.  Log onto MyChart to view the test results when they post in 48 hours. Advised to use Mucinex DM for cough and congestion, Tylenol or ibuprofen for fever or discomfort. Advised to follow-up PCP or return to urgent care if symptoms fail to improve.    ED Prescriptions   None    PDMP not reviewed this encounter.   Ellsworth Lennox, PA-C 11/05/22 1014

## 2022-11-05 NOTE — Discharge Instructions (Signed)
Completed in 48 hours.  If you do not hear from this office that indicates the test is negative.  Log onto MyChart to view the test results when they post in 48 hours. Advised to use Mucinex DM for cough and congestion, Tylenol or ibuprofen for fever or discomfort. Advised to follow-up PCP or return to urgent care if symptoms fail to improve.

## 2022-11-06 LAB — SARS CORONAVIRUS 2 (TAT 6-24 HRS): SARS Coronavirus 2: NEGATIVE

## 2022-11-11 ENCOUNTER — Ambulatory Visit: Payer: Medicaid Other

## 2022-11-15 ENCOUNTER — Ambulatory Visit: Payer: Medicaid Other

## 2023-07-19 ENCOUNTER — Ambulatory Visit
Admission: EM | Admit: 2023-07-19 | Discharge: 2023-07-19 | Disposition: A | Discharge status: Home / Self Care | Payer: Medicaid Other | Attending: Internal Medicine | Admitting: Internal Medicine

## 2023-07-19 DIAGNOSIS — N898 Other specified noninflammatory disorders of vagina: Secondary | ICD-10-CM | POA: Insufficient documentation

## 2023-07-19 NOTE — ED Triage Notes (Signed)
Pt states that she has a vaginal odor and vaginal discharge. X2-3 days

## 2023-07-19 NOTE — ED Provider Notes (Signed)
[facility]    CSN: [id number] Arrival date & time: 07/19/23  1255      History   Chief Complaint Chief Complaint  Patient presents with   vaginal odor    Vaginal odor x2-3 days    HPI Danielle Robertson is a 22 y.o. female.   Patient presents with a clear vaginal discharge that started about 2 to 3 days ago.  Denies any other associated symptoms.  Reports that she has not been sexually active for approximately 1 year so has no concern for STD.  Last menstrual cycle was 07/12/2023.     Past Medical History:  Diagnosis Date   Breakthrough bleeding on depo provera 05/17/2022    Patient Active Problem List   Diagnosis Date Noted   Oral contraceptive use 09/09/2022   Dysmenorrhea 09/09/2022    History reviewed. No pertinent surgical history.  OB History     Gravida  0   Para  0   Term  0   Preterm  0   AB  0   Living  0      SAB  0   IAB  0   Ectopic  0   Multiple  0   Live Births  0            Home Medications    Prior to Admission medications   Medication Sig Start Date End Date Taking? Authorizing Provider  fluticasone (FLONASE) 50 MCG/ACT nasal spray Place 2 sprays into both nostrils daily. 12/04/19   Cathie Hoops, Amy V, PA-C  ibuprofen (ADVIL) 800 MG tablet Take 1 tablet (800 mg total) by mouth every 8 (eight) hours as needed. Patient not taking: Reported on 09/09/2022 01/23/22   Brock Bad, MD  metroNIDAZOLE (FLAGYL) 500 MG tablet Take 1 tablet (500 mg total) by mouth 2 (two) times daily. 09/27/22   Tomi Bamberger, PA-C  Norethindrone Acetate-Ethinyl Estrad-FE (LOESTRIN 24 FE) 1-20 MG-MCG(24) tablet Take 1 tablet by mouth daily. 07/30/22   Warden Fillers, MD  Prenat-Fe Poly-Methfol-FA-DHA (VITAFOL ULTRA) 29-0.6-0.4-200 MG CAPS Take 1 capsule by mouth daily before breakfast. Patient not taking: Reported on 05/17/2022 01/23/22   Brock Bad, MD  albuterol (VENTOLIN HFA) 108 (90 Base) MCG/ACT inhaler Inhale 1-2 puffs into the lungs  every 6 (six) hours as needed for wheezing or shortness of breath. 12/04/19 05/17/20  Cathie Hoops, Amy V, PA-C  Norethindrone-Ethinyl Estradiol-Fe Biphas (LO LOESTRIN FE) 1 MG-10 MCG / 10 MCG tablet Take 1 tablet by mouth daily. 11/08/19 05/17/20  Leftwich-Kirby, Wilmer Floor, CNM    Family History Family History  Problem Relation Age of Onset   Diabetes Maternal Grandmother    Diabetes Maternal Grandfather    COPD Paternal Grandmother        Smoker     Social History Social History   Tobacco Use   Smoking status: Never    Passive exposure: Never   Smokeless tobacco: Never  Vaping Use   Vaping status: Never Used  Substance Use Topics   Alcohol use: Yes    Comment: occ   Drug use: No     Allergies   Patient has no known allergies.   Review of Systems Review of Systems Per HPI  Physical Exam Triage Vital Signs ED Triage Vitals  Encounter Vitals Group     BP 07/19/23 1358 114/71     Systolic BP Percentile --      Diastolic BP Percentile --      Pulse Rate 07/19/23  1358 99     Resp 07/19/23 1358 19     Temp 07/19/23 1358 98.4 F (36.9 C)     Temp Source 07/19/23 1358 Oral     SpO2 07/19/23 1358 98 %     Weight 07/19/23 1356 130 lb (59 kg)     Height 07/19/23 1356 5' 7.5" (1.715 m)     Head Circumference --      Peak Flow --      Pain Score 07/19/23 1356 0     Pain Loc --      Pain Education --      Exclude from Growth Chart --    No data found.  Updated Vital Signs BP 114/71 (BP Location: Left Arm)   Pulse 99   Temp 98.4 F (36.9 C) (Oral)   Resp 19   Ht 5' 7.5" (1.715 m)   Wt 130 lb (59 kg)   LMP 07/12/2023   SpO2 98%   BMI 20.06 kg/m   Visual Acuity Right Eye Distance:   Left Eye Distance:   Bilateral Distance:    Right Eye Near:   Left Eye Near:    Bilateral Near:     Physical Exam Constitutional:      General: She is not in acute distress.    Appearance: Normal appearance. She is not toxic-appearing or diaphoretic.  HENT:     Head: Normocephalic  and atraumatic.  Eyes:     Extraocular Movements: Extraocular movements intact.     Conjunctiva/sclera: Conjunctivae normal.  Pulmonary:     Effort: Pulmonary effort is normal.  Genitourinary:    Comments: Deferred with shared decision making.  Self swab performed. Neurological:     General: No focal deficit present.     Mental Status: She is alert and oriented to person, place, and time. Mental status is at baseline.  Psychiatric:        Mood and Affect: Mood normal.        Behavior: Behavior normal.        Thought Content: Thought content normal.        Judgment: Judgment normal.      UC Treatments / Results  Labs (all labs ordered are listed, but only abnormal results are displayed) Labs Reviewed  CERVICOVAGINAL ANCILLARY ONLY    EKG   Radiology No results found.  Procedures Procedures (including critical care time)  Medications Ordered in UC Medications - No data to display  Initial Impression / Assessment and Plan / UC Course  I have reviewed the triage vital signs and the nursing notes.  Pertinent labs & imaging results that were available during my care of the patient were reviewed by me and considered in my medical decision making (see chart for details).     Cervicovaginal swab pending.  Will await results prior to treatment.  Advised patient to follow-up with any further concerns.  Patient verbalized understanding and was agreeable with plan. Final Clinical Impressions(s) / UC Diagnoses   Final diagnoses:  Vaginal discharge     Discharge Instructions      Vaginal swab is pending.  We will call when it results.    ED Prescriptions   None    PDMP not reviewed this encounter.   Gustavus Bryant, [place] 07/19/23 1450

## 2023-07-19 NOTE — Discharge Instructions (Signed)
Vaginal swab is pending.  We will call when it results.

## 2023-07-21 LAB — CERVICOVAGINAL ANCILLARY ONLY
Bacterial Vaginitis (gardnerella): NEGATIVE
Candida Glabrata: NEGATIVE
Candida Vaginitis: NEGATIVE
Comment: NEGATIVE
Comment: NEGATIVE
Comment: NEGATIVE

## 2023-08-11 ENCOUNTER — Ambulatory Visit: Payer: Medicaid Other | Admitting: Obstetrics and Gynecology

## 2023-08-17 ENCOUNTER — Ambulatory Visit: Payer: Medicaid Other

## 2023-09-24 ENCOUNTER — Ambulatory Visit: Payer: Medicaid Other | Admitting: Obstetrics and Gynecology

## 2023-09-24 ENCOUNTER — Other Ambulatory Visit (HOSPITAL_COMMUNITY)
Admission: RE | Admit: 2023-09-24 | Discharge: 2023-09-24 | Disposition: A | Discharge status: Home / Self Care | Payer: Medicaid Other | Source: Ambulatory Visit | Attending: Obstetrics and Gynecology | Admitting: Obstetrics and Gynecology

## 2023-09-24 ENCOUNTER — Encounter: Payer: Self-pay | Admitting: Obstetrics and Gynecology

## 2023-09-24 VITALS — BP 123/86 | HR 102 | Ht 68.0 in | Wt 141.0 lb

## 2023-09-24 DIAGNOSIS — Z1339 Encounter for screening examination for other mental health and behavioral disorders: Secondary | ICD-10-CM | POA: Diagnosis not present

## 2023-09-24 DIAGNOSIS — R87610 Atypical squamous cells of undetermined significance on cytologic smear of cervix (ASC-US): Secondary | ICD-10-CM | POA: Diagnosis not present

## 2023-09-24 DIAGNOSIS — Z01419 Encounter for gynecological examination (general) (routine) without abnormal findings: Secondary | ICD-10-CM | POA: Diagnosis present

## 2023-09-24 DIAGNOSIS — Z124 Encounter for screening for malignant neoplasm of cervix: Secondary | ICD-10-CM | POA: Insufficient documentation

## 2023-09-24 DIAGNOSIS — Z113 Encounter for screening for infections with a predominantly sexual mode of transmission: Secondary | ICD-10-CM | POA: Diagnosis not present

## 2023-09-24 NOTE — Progress Notes (Signed)
GYNECOLOGY ANNUAL PREVENTATIVE CARE ENCOUNTER NOTE  History:     Danielle Robertson is a 22 y.o. G0P0000 female here for a routine annual gynecologic exam.  Current complaints: occasional vaginal discharge.   Denies abnormal vaginal bleeding, discharge, pelvic pain, problems with intercourse or other gynecologic concerns.   Regular menses lasting 7 days.   Gynecologic History Patient's last menstrual period was 08/31/2023 (exact date). Contraception: abstinence Last Pap: 3/22. Results were: normal with negative HPV Last mammogram: n/a  Obstetric History OB History  Gravida Para Term Preterm AB Living  0 0 0 0 0 0  SAB IAB Ectopic Multiple Live Births  0 0 0 0 0    Past Medical History:  Diagnosis Date   Breakthrough bleeding on depo provera 05/17/2022    No past surgical history on file.  Current Outpatient Medications on File Prior to Visit  Medication Sig Dispense Refill   fluticasone (FLONASE) 50 MCG/ACT nasal spray Place 2 sprays into both nostrils daily. 1 g 0   ibuprofen (ADVIL) 800 MG tablet Take 1 tablet (800 mg total) by mouth every 8 (eight) hours as needed. (Patient not taking: Reported on 09/09/2022) 30 tablet 5   metroNIDAZOLE (FLAGYL) 500 MG tablet Take 1 tablet (500 mg total) by mouth 2 (two) times daily. (Patient not taking: Reported on 09/24/2023) 14 tablet 0   Norethindrone Acetate-Ethinyl Estrad-FE (LOESTRIN 24 FE) 1-20 MG-MCG(24) tablet Take 1 tablet by mouth daily. (Patient not taking: Reported on 09/24/2023) 28 tablet 11   Prenat-Fe Poly-Methfol-FA-DHA (VITAFOL ULTRA) 29-0.6-0.4-200 MG CAPS Take 1 capsule by mouth daily before breakfast. (Patient not taking: Reported on 05/17/2022) 90 capsule 4   [DISCONTINUED] albuterol (VENTOLIN HFA) 108 (90 Base) MCG/ACT inhaler Inhale 1-2 puffs into the lungs every 6 (six) hours as needed for wheezing or shortness of breath. 8 g 0   [DISCONTINUED] Norethindrone-Ethinyl Estradiol-Fe Biphas (LO LOESTRIN FE) 1 MG-10 MCG / 10  MCG tablet Take 1 tablet by mouth daily. 1 Package 11   No current facility-administered medications on file prior to visit.    No Known Allergies  Social History:  reports that she has never smoked. She has never been exposed to tobacco smoke. She has never used smokeless tobacco. She reports current alcohol use. She reports that she does not use drugs.  Family History  Problem Relation Age of Onset   Diabetes Maternal Grandmother    Diabetes Maternal Grandfather    COPD Paternal Grandmother        Smoker     The following portions of the patient's history were reviewed and updated as appropriate: allergies, current medications, past family history, past medical history, past social history, past surgical history and problem list.  Review of Systems Pertinent items noted in HPI and remainder of comprehensive ROS otherwise negative.  Physical Exam:  BP 123/86   Pulse (!) 102   Ht 5\' 8"  (1.727 m)   Wt 141 lb (64 kg)   LMP 08/31/2023 (Exact Date)   BMI 21.44 kg/m  CONSTITUTIONAL: Well-developed, well-nourished female in no acute distress.  HENT:  Normocephalic, atraumatic, External right and left ear normal. Oropharynx is clear and moist EYES: Conjunctivae and EOM are normal.  NECK: Normal range of motion, supple, no masses.  Normal thyroid.  SKIN: Skin is warm and dry. No rash noted. Not diaphoretic. No erythema. No pallor. MUSCULOSKELETAL: Normal range of motion. No tenderness.  No cyanosis, clubbing, or edema.  2+ distal pulses. NEUROLOGIC: Alert and oriented to person, place,  and time. Normal reflexes, muscle tone coordination.  PSYCHIATRIC: Normal mood and affect. Normal behavior. Normal judgment and thought content. CARDIOVASCULAR: Normal heart rate noted, regular rhythm RESPIRATORY: Clear to auscultation bilaterally. Effort and breath sounds normal, no problems with respiration noted. BREASTS: Symmetric in size. No masses, tenderness, skin changes, nipple drainage, or  lymphadenopathy bilaterally. Performed in the presence of a chaperone. ABDOMEN: Soft, no distention noted.  No tenderness, rebound or guarding.  PELVIC: Normal appearing external genitalia and urethral meatus; normal appearing vaginal mucosa and cervix.  No abnormal discharge noted.  Pap smear obtained.  Normal discharge.  Vaginal swab taken. Normal uterine size, no other palpable masses, no uterine or adnexal tenderness.  Retroverted uterus. Performed in the presence of a chaperone.   Assessment and Plan:    1. Women's annual routine gynecological examination [Z01.419] Normal annual exam Pap in 3 years if normal - Cytology - PAP( [place])  2. Routine screening for STI (sexually transmitted infection) Per pt request - Cervicovaginal ancillary only( [place]) - Hepatitis C Antibody - Hepatitis B Surface AntiGEN - RPR - HIV Antibody (routine testing w rflx)  Will follow up results of pap smear and manage accordingly. Routine preventative health maintenance measures emphasized. Please refer to After Visit Summary for other counseling recommendations.      Mariel Aloe, MD, FACOG Obstetrician & Gynecologist, [facility] for [facility], [facility]

## 2023-09-24 NOTE — Progress Notes (Signed)
22 y.o. GYN presents for AEX/PAP/STD Screening.  C/o clear discharge, odor.

## 2023-09-25 LAB — CERVICOVAGINAL ANCILLARY ONLY
Chlamydia: NEGATIVE
Comment: NEGATIVE
Comment: NEGATIVE
Comment: NORMAL
Neisseria Gonorrhea: NEGATIVE
Trichomonas: NEGATIVE

## 2023-09-25 LAB — HEPATITIS B SURFACE ANTIGEN: Hepatitis B Surface Ag: NEGATIVE

## 2023-09-25 LAB — HEPATITIS C ANTIBODY: Hep C Virus Ab: NONREACTIVE

## 2023-09-25 LAB — HIV ANTIBODY (ROUTINE TESTING W REFLEX): HIV Screen 4th Generation wRfx: NONREACTIVE

## 2023-09-25 LAB — RPR: RPR Ser Ql: NONREACTIVE

## 2023-09-26 ENCOUNTER — Encounter: Payer: Self-pay | Admitting: Obstetrics and Gynecology

## 2023-09-26 LAB — CYTOLOGY - PAP
Comment: NEGATIVE
Diagnosis: UNDETERMINED — AB
High risk HPV: NEGATIVE

## 2023-10-09 ENCOUNTER — Ambulatory Visit: Payer: Medicaid Other | Admitting: Obstetrics and Gynecology

## 2023-12-16 ENCOUNTER — Ambulatory Visit
Admission: RE | Admit: 2023-12-16 | Discharge: 2023-12-16 | Disposition: A | Discharge status: Home / Self Care | Payer: Medicaid Other | Source: Ambulatory Visit | Attending: Family Medicine | Admitting: Family Medicine

## 2023-12-16 VITALS — BP 117/80 | HR 94 | Temp 98.7°F | Resp 18

## 2023-12-16 DIAGNOSIS — N898 Other specified noninflammatory disorders of vagina: Secondary | ICD-10-CM | POA: Diagnosis not present

## 2023-12-16 MED ORDER — FLUCONAZOLE 150 MG PO TABS: ORAL_TABLET | ORAL | 0 refills | Status: DC

## 2023-12-16 MED ORDER — METRONIDAZOLE 500 MG PO TABS: 500.0000 mg | ORAL_TABLET | Freq: Two times a day (BID) | ORAL | 0 refills | Status: DC

## 2023-12-16 NOTE — ED Triage Notes (Signed)
Patient states a 3-day history of vaginal odor. Denies any other symptoms at this time.

## 2023-12-16 NOTE — Discharge Instructions (Signed)
We have sent testing for various causes of vaginal infections. We will notify you of any positive results once they are received. If required, we will prescribe any medications you might need.  Please refrain from all sexual activity for at least the next seven days.  

## 2023-12-17 NOTE — ED Provider Notes (Signed)
[facility]   [id number] 12/16/23 Arrival Time: 1707  ASSESSMENT & PLAN:  1. Vaginal odor    Meds ordered this encounter  Medications   fluconazole (DIFLUCAN) 150 MG tablet    Sig: Take one tablet by mouth as a single dose. May repeat in 3 days if symptoms persist.    Dispense:  2 tablet    Refill:  0   metroNIDAZOLE (FLAGYL) 500 MG tablet    Sig: Take 1 tablet (500 mg total) by mouth 2 (two) times daily.    Dispense:  14 tablet    Refill:  0      Discharge Instructions      We have sent testing for various causes of vaginal infections. We will notify you of any positive results once they are received. If required, we will prescribe any medications you might need.  Please refrain from all sexual activity for at least the next seven days.     Without s/s of PID.  Labs Reviewed  CERVICOVAGINAL ANCILLARY ONLY    Will notify of any positive results. Instructed to refrain from sexual activity for at least seven days.  Reviewed expectations re: course of current medical issues. Questions answered. Outlined signs and symptoms indicating need for more acute intervention. Patient verbalized understanding. After Visit Summary given.   SUBJECTIVE:  Danielle Robertson is a 23 y.o. female who presents with complaint of vaginal discharge. Three days; with vaginal odor. Ques BV vs yeast.  Request STI screening.   Patient's last menstrual period was 12/10/2023 (approximate).   OBJECTIVE:  Vitals:   12/16/23 1753  BP: 117/80  Pulse: 94  Resp: 18  Temp: 98.7 F (37.1 C)  TempSrc: Oral  SpO2: 99%     General appearance: alert, cooperative, appears stated age and no distress Lungs: unlabored respirations; speaks full sentences without difficulty Back: no CVA tenderness; FROM at waist Abdomen: soft, non-tender GU: deferred Skin: warm and dry Psychological: alert and cooperative; normal mood and affect.    Labs Reviewed  CERVICOVAGINAL ANCILLARY ONLY     No Known Allergies  Past Medical History:  Diagnosis Date   Breakthrough bleeding on depo provera 05/17/2022   Family History  Problem Relation Age of Onset   Diabetes Maternal Grandmother    Diabetes Maternal Grandfather    COPD Paternal Grandmother        Smoker    Social History   Socioeconomic History   Marital status: Single    Spouse name: Not on file   Number of children: Not on file   Years of education: Not on file   Highest education level: Not on file  Occupational History   Not on file  Tobacco Use   Smoking status: Never    Passive exposure: Never   Smokeless tobacco: Never  Vaping Use   Vaping status: Never Used  Substance and Sexual Activity   Alcohol use: Yes    Comment: occ   Drug use: No   Sexual activity: Not Currently  Other Topics Concern   Not on file  Social History Narrative   Not on file   Social Drivers of Health   Financial Resource Strain: Not on file  Food Insecurity: Not on file  Transportation Needs: Not on file  Physical Activity: Not on file  Stress: Not on file  Social Connections: Unknown (04/09/2022)   Received from [facility], [facility]   Social Network    Social Network: Not on file  Intimate Partner Violence: Unknown (  03/01/2022)   Received from [facility], [facility]   HITS    Physically Hurt: Not on file    Insult or Talk Down To: Not on file    Threaten Physical Harm: Not on file    Scream or Curse: Not on file           Mardella Layman, MD 12/17/23 1337

## 2023-12-18 LAB — CERVICOVAGINAL ANCILLARY ONLY
Bacterial Vaginitis (gardnerella): NEGATIVE
Candida Glabrata: NEGATIVE
Candida Vaginitis: NEGATIVE
Chlamydia: NEGATIVE
Comment: NEGATIVE
Comment: NEGATIVE
Comment: NEGATIVE
Comment: NEGATIVE
Comment: NEGATIVE
Comment: NORMAL
Neisseria Gonorrhea: NEGATIVE
Trichomonas: NEGATIVE

## 2024-04-13 ENCOUNTER — Ambulatory Visit: Admitting: Obstetrics and Gynecology

## 2024-04-16 ENCOUNTER — Ambulatory Visit: Payer: Self-pay

## 2024-05-06 ENCOUNTER — Encounter: Payer: Self-pay | Admitting: Physician Assistant

## 2024-05-06 ENCOUNTER — Other Ambulatory Visit: Payer: Self-pay | Admitting: Physician Assistant

## 2024-05-06 ENCOUNTER — Other Ambulatory Visit (HOSPITAL_COMMUNITY)
Admission: RE | Admit: 2024-05-06 | Discharge: 2024-05-06 | Disposition: A | Discharge status: Home / Self Care | Source: Ambulatory Visit | Attending: Physician Assistant | Admitting: Physician Assistant

## 2024-05-06 ENCOUNTER — Ambulatory Visit: Admitting: Physician Assistant

## 2024-05-06 VITALS — BP 125/83 | HR 103 | Ht 68.0 in | Wt 143.1 lb

## 2024-05-06 DIAGNOSIS — N946 Dysmenorrhea, unspecified: Secondary | ICD-10-CM | POA: Diagnosis not present

## 2024-05-06 DIAGNOSIS — Z1331 Encounter for screening for depression: Secondary | ICD-10-CM | POA: Diagnosis not present

## 2024-05-06 DIAGNOSIS — Z01419 Encounter for gynecological examination (general) (routine) without abnormal findings: Secondary | ICD-10-CM | POA: Diagnosis present

## 2024-05-06 MED ORDER — IBUPROFEN 800 MG PO TABS: 800.0000 mg | ORAL_TABLET | Freq: Three times a day (TID) | ORAL | 3 refills | Status: DC | PRN

## 2024-05-06 NOTE — Progress Notes (Signed)
 ANNUAL EXAM Patient name: Danielle Robertson MRN [id number]  Date of birth: Apr 25, 2001 Chief Complaint:   No chief complaint on file.  History of Present Illness:   Danielle Robertson is a 23 y.o. G0P0000 female being seen today for a routine annual exam.   Current complaints: intense cramping with menses. Previously on Ibuprofen  800 mg but discontinued these after going on OCPs. Now that she is no longer taking OCPs, would like renewed prescription of ibuprofen .  Patient's last menstrual period was 04/30/2024 (approximate).   The pregnancy intention screening data noted above was reviewed. Potential methods of contraception were discussed. The patient elected to proceed with No data recorded.   Last pap 09/24/23 ASCUS HPV neg with no previous abnormal pap smear.  Last mammogram: Never previously done due to age.  Family h/o breast cancer: no Last colonoscopy: Never previously done due to age.  Family h/o colorectal cancer: no STI screening: Accepts Contraception: Not currently sexually active     05/06/2024    2:31 PM 09/24/2023    4:31 PM 05/17/2022    8:41 AM  Depression screen PHQ 2/9  Decreased Interest 0 0 0  Down, Depressed, Hopeless 0 0 0  PHQ - 2 Score 0 0 0  Altered sleeping 0 0 0  Tired, decreased energy 0 0 0  Change in appetite 0 0 0  Feeling bad or failure about yourself  0 0 0  Trouble concentrating 0 0 0  Moving slowly or fidgety/restless 0 0 0  Suicidal thoughts 0 0 0  PHQ-9 Score 0 0 0  Difficult doing work/chores  Not difficult at all         05/06/2024    2:31 PM 09/24/2023    4:31 PM 05/17/2022    8:42 AM  GAD 7 : Generalized Anxiety Score  Nervous, Anxious, on Edge 0 0 0  Control/stop worrying 0 0 0  Worry too much - different things 0 0 0  Trouble relaxing 0 0 0  Restless 0 0 0  Easily annoyed or irritable 0 0 0  Afraid - awful might happen 0 0 0  Total GAD 7 Score 0 0 0  Anxiety Difficulty  Not difficult at all      Review of Systems:    Pertinent items are noted in HPI Denies any headaches, blurred vision, fatigue, shortness of breath, chest pain, abdominal pain, abnormal vaginal discharge/itching/odor/irritation, problems with periods, bowel movements, urination, or intercourse unless otherwise stated above. Pertinent History Reviewed:  Reviewed past medical,surgical, social and family history.  Reviewed problem list, medications and allergies. Physical Assessment:   Vitals:   05/06/24 1421  BP: 125/83  Pulse: (!) 103  Weight: 143 lb 1.6 oz (64.9 kg)  Height: 5' 8 (1.727 m)  Body mass index is 21.76 kg/m.        Physical Examination:   General appearance - well appearing, and in no distress  Mental status - alert, oriented to person, place, and time  Psych:  She has a normal mood and affect  Skin - warm and dry, normal color, no suspicious lesions noted  Chest - effort normal, all lung fields clear to auscultation bilaterally  Heart - normal rate and regular rhythm  Neck:  midline trachea, no thyromegaly or nodules  Breasts - breasts appear normal, no suspicious masses, no skin or nipple changes or  axillary nodes  Abdomen - soft, nontender, nondistended, no masses or organomegaly  Extremities:  No swelling or varicosities noted  Chaperone  present for exam  No results found for this or any previous visit (from the past 24 hours).  Assessment & Plan:   1. Women's annual routine gynecological examination (Primary) - Cervical cancer screening: Discussed screening Q3 years. Reviewed importance of annual exams and limits of pap smear. Pap with reflex HPV due for repeat 2027.  - GC/CT: Discussed and recommended. Pt  accepts - Birth Control: none, patient not sexually active currently - Breast Health: Encouraged self breast awareness/exams. Teaching provided. - Mammogram: @ 25yo, or sooner if problems - Colonoscopy: @ 25yo, or sooner if problems - Follow-up: 12 months and prn  - Cervicovaginal ancillary only(  [place]) - HIV antibody (with reflex) - RPR - Hepatitis C Antibody - Hepatitis B Surface AntiGEN  2. Dysmenorrhea - ibuprofen  (ADVIL ) 800 MG tablet; Take 1 tablet (800 mg total) by mouth 3 (three) times daily with meals as needed for headache, moderate pain (pain score 4-6) or cramping.  Dispense: 30 tablet; Refill: 3   Orders Placed This Encounter  Procedures   HIV antibody (with reflex)   RPR   Hepatitis C Antibody   Hepatitis B Surface AntiGEN   Meds:  Meds ordered this encounter  Medications   ibuprofen  (ADVIL ) 800 MG tablet    Sig: Take 1 tablet (800 mg total) by mouth 3 (three) times daily with meals as needed for headache, moderate pain (pain score 4-6) or cramping.    Dispense:  30 tablet    Refill:  3   Follow-up: No follow-ups on file.  Vondra Aldredge E Buckley [place], [place] 05/06/2024 5:46 PM

## 2024-05-07 LAB — RPR: RPR Ser Ql: NONREACTIVE

## 2024-05-07 LAB — HEPATITIS B SURFACE ANTIGEN: Hepatitis B Surface Ag: NEGATIVE

## 2024-05-07 LAB — CERVICOVAGINAL ANCILLARY ONLY
Bacterial Vaginitis (gardnerella): NEGATIVE
Candida Glabrata: NEGATIVE
Candida Vaginitis: NEGATIVE
Chlamydia: NEGATIVE
Comment: NEGATIVE
Comment: NEGATIVE
Comment: NEGATIVE
Comment: NEGATIVE
Comment: NEGATIVE
Comment: NORMAL
Neisseria Gonorrhea: NEGATIVE
Trichomonas: NEGATIVE

## 2024-05-07 LAB — HIV ANTIBODY (ROUTINE TESTING W REFLEX): HIV Screen 4th Generation wRfx: NONREACTIVE

## 2024-05-07 LAB — HEPATITIS C ANTIBODY: Hep C Virus Ab: NONREACTIVE

## 2024-05-10 ENCOUNTER — Ambulatory Visit (HOSPITAL_COMMUNITY): Payer: Self-pay | Admitting: Physician Assistant

## 2024-07-30 ENCOUNTER — Ambulatory Visit

## 2024-09-15 ENCOUNTER — Ambulatory Visit
Admission: EM | Admit: 2024-09-15 | Discharge: 2024-09-15 | Disposition: A | Discharge status: Home / Self Care | Attending: Family Medicine | Admitting: Family Medicine

## 2024-09-15 ENCOUNTER — Encounter: Payer: Self-pay | Admitting: Emergency Medicine

## 2024-09-15 DIAGNOSIS — J069 Acute upper respiratory infection, unspecified: Secondary | ICD-10-CM

## 2024-09-15 DIAGNOSIS — R070 Pain in throat: Secondary | ICD-10-CM | POA: Diagnosis present

## 2024-09-15 LAB — POCT RAPID STREP A (OFFICE): Rapid Strep A Screen: NEGATIVE

## 2024-09-15 LAB — POC SOFIA SARS ANTIGEN FIA: SARS Coronavirus 2 Ag: NEGATIVE

## 2024-09-15 MED ORDER — IBUPROFEN 600 MG PO TABS: 600.0000 mg | ORAL_TABLET | Freq: Three times a day (TID) | ORAL | 0 refills | Status: AC | PRN

## 2024-09-15 MED ORDER — BENZONATATE 100 MG PO CAPS: 100.0000 mg | ORAL_CAPSULE | Freq: Three times a day (TID) | ORAL | 0 refills | Status: AC | PRN

## 2024-09-15 NOTE — ED Triage Notes (Signed)
 Pt presents c/o sore throat  x 3 days. Pt states,  I have been experiencing itchy sore throat that is causing me to have a dry cough.  Pt denies emesis, diarrhea, or additional sxs.

## 2024-09-15 NOTE — Discharge Instructions (Signed)
 The COVID test was negative  Your strep test is negative.  Culture of the throat will be sent, and staff will notify you if that is in turn positive.  Take benzonatate 100 mg, 1 tab every 8 hours as needed for cough.  Take ibuprofen  600 mg--1 tab every 8 hours as needed for pain.  You can also take DayQuil/NyQuil as needed for your symptoms.

## 2024-09-15 NOTE — ED Provider Notes (Signed)
 [facility]    CSN: [id number] Arrival date & time: 09/15/24  1743      History   Chief Complaint Chief Complaint  Patient presents with   Sore Throat    HPI Danielle Robertson is a 23 y.o. female.    Sore Throat  Here for postnasal drainage and nasal congestion and sore throat and now cough.  She began having some postnasal drainage and sore throat about 3 days ago.  At that time it hurt to swallow.  That it improved some but then she has had some headache that has come and gone.  This morning she had some rhinorrhea that is actually improved already.  Her throat now feels like there is something draining and it was sticky that she cannot get out its sort of itchy or dry.  That seems to make her cough.  NKDA  No fever or chills  No nausea or vomiting  Last menstrual cycle began on October 18  Past Medical History:  Diagnosis Date   Breakthrough bleeding on depo provera  05/17/2022    Patient Active Problem List   Diagnosis Date Noted   Oral contraceptive use 09/09/2022   Dysmenorrhea 09/09/2022    History reviewed. No pertinent surgical history.  OB History     Gravida  0   Para  0   Term  0   Preterm  0   AB  0   Living  0      SAB  0   IAB  0   Ectopic  0   Multiple  0   Live Births  0            Home Medications    Prior to Admission medications   Medication Sig Start Date End Date Taking? Authorizing Provider  benzonatate (TESSALON) 100 MG capsule Take 1 capsule (100 mg total) by mouth 3 (three) times daily as needed for cough. 09/15/24  Yes Vonna Sharlet POUR, MD  ibuprofen  (ADVIL ) 600 MG tablet Take 1 tablet (600 mg total) by mouth every 8 (eight) hours as needed (pain). 09/15/24  Yes Vonna Sharlet POUR, MD  albuterol  (VENTOLIN  HFA) 108 (90 Base) MCG/ACT inhaler Inhale 1-2 puffs into the lungs every 6 (six) hours as needed for wheezing or shortness of breath. 12/04/19 05/17/20  Babara, Amy V, PA-C  Norethindrone-Ethinyl  Estradiol -Fe Biphas (LO LOESTRIN FE) 1 MG-10 MCG / 10 MCG tablet Take 1 tablet by mouth daily. 11/08/19 05/17/20  Leftwich-Kirby, Olam LABOR, CNM    Family History Family History  Problem Relation Age of Onset   Diabetes Maternal Grandmother    Diabetes Maternal Grandfather    COPD Paternal Grandmother        Smoker     Social History Social History   Tobacco Use   Smoking status: Never    Passive exposure: Never   Smokeless tobacco: Never  Vaping Use   Vaping status: Former   Substances: Nicotine, Flavoring  Substance Use Topics   Alcohol use: Yes    Comment: occ   Drug use: No     Allergies   Patient has no known allergies.   Review of Systems Review of Systems   Physical Exam Triage Vital Signs ED Triage Vitals  Encounter Vitals Group     BP 09/15/24 1805 121/75     Girls Systolic BP Percentile --      Girls Diastolic BP Percentile --      Boys Systolic BP Percentile --  Boys Diastolic BP Percentile --      Pulse Rate 09/15/24 1805 92     Resp 09/15/24 1805 16     Temp 09/15/24 1805 98 F (36.7 C)     Temp Source 09/15/24 1805 Oral     SpO2 09/15/24 1805 97 %     Weight 09/15/24 1804 143 lb 1.3 oz (64.9 kg)     Height --      Head Circumference --      Peak Flow --      Pain Score 09/15/24 1804 0     Pain Loc --      Pain Education --      Exclude from Growth Chart --    No data found.  Updated Vital Signs BP 121/75 (BP Location: Left Arm)   Pulse 92   Temp 98 F (36.7 C) (Oral)   Resp 16   Wt 64.9 kg   LMP 09/11/2024 (Exact Date)   SpO2 97%   BMI 21.76 kg/m   Visual Acuity Right Eye Distance:   Left Eye Distance:   Bilateral Distance:    Right Eye Near:   Left Eye Near:    Bilateral Near:     Physical Exam Vitals reviewed.  Constitutional:      General: She is not in acute distress.    Appearance: She is not toxic-appearing.  HENT:     Right Ear: Tympanic membrane and ear canal normal.     Left Ear: Tympanic membrane and  ear canal normal.     Nose: Congestion present.     Mouth/Throat:     Mouth: Mucous membranes are moist.     Comments: There is mild erythema of the tonsillar pillars.  Clear exudate is draining in the oropharynx. Eyes:     Extraocular Movements: Extraocular movements intact.     Conjunctiva/sclera: Conjunctivae normal.     Pupils: Pupils are equal, round, and reactive to light.  Cardiovascular:     Rate and Rhythm: Normal rate and regular rhythm.     Heart sounds: No murmur heard. Pulmonary:     Effort: No respiratory distress.     Breath sounds: No stridor. No wheezing, rhonchi or rales.  Musculoskeletal:     Cervical back: Neck supple.  Lymphadenopathy:     Cervical: No cervical adenopathy.  Skin:    Coloration: Skin is not jaundiced or pale.  Neurological:     General: No focal deficit present.     Mental Status: She is alert and oriented to person, place, and time.  Psychiatric:        Behavior: Behavior normal.      UC Treatments / Results  Labs (all labs ordered are listed, but only abnormal results are displayed) Labs Reviewed  POCT RAPID STREP A (OFFICE) - Normal  CULTURE, GROUP A STREP ([facility])  POC SOFIA SARS ANTIGEN FIA    EKG   Radiology No results found.  Procedures Procedures (including critical care time)  Medications Ordered in UC Medications - No data to display  Initial Impression / Assessment and Plan / UC Course  I have reviewed the triage vital signs and the nursing notes.  Pertinent labs & imaging results that were available during my care of the patient were reviewed by me and considered in my medical decision making (see chart for details).     The COVID test was negative  Rapid strep is negative.  Throat culture is sent and we will notify and treat  protocol if that is positive  Tessalon Perles and ibuprofen  are sent in for her symptoms. Final Clinical Impressions(s) / UC Diagnoses   Final diagnoses:  Viral URI  Throat pain      Discharge Instructions      The COVID test was negative  Your strep test is negative.  Culture of the throat will be sent, and staff will notify you if that is in turn positive.  Take benzonatate 100 mg, 1 tab every 8 hours as needed for cough.  Take ibuprofen  600 mg--1 tab every 8 hours as needed for pain.  You can also take DayQuil/NyQuil as needed for your symptoms.     ED Prescriptions     Medication Sig Dispense Auth. Provider   benzonatate (TESSALON) 100 MG capsule Take 1 capsule (100 mg total) by mouth 3 (three) times daily as needed for cough. 21 capsule Vonna Sharlet POUR, MD   ibuprofen  (ADVIL ) 600 MG tablet Take 1 tablet (600 mg total) by mouth every 8 (eight) hours as needed (pain). 15 tablet Morley Gaumer K, MD      PDMP not reviewed this encounter.   Vonna Sharlet POUR, MD 09/15/24 [phone]

## 2024-09-18 LAB — CULTURE, GROUP A STREP (THRC)

## 2024-09-20 ENCOUNTER — Ambulatory Visit (HOSPITAL_COMMUNITY): Payer: Self-pay

## 2024-12-23 ENCOUNTER — Other Ambulatory Visit: Payer: Self-pay

## 2024-12-23 ENCOUNTER — Ambulatory Visit
Admission: RE | Admit: 2024-12-23 | Discharge: 2024-12-23 | Disposition: A | Discharge status: Home / Self Care | Source: Ambulatory Visit

## 2024-12-23 VITALS — BP 120/73 | HR 95 | Temp 98.5°F | Resp 18 | Wt 143.1 lb

## 2024-12-23 DIAGNOSIS — N760 Acute vaginitis: Secondary | ICD-10-CM | POA: Insufficient documentation

## 2024-12-23 DIAGNOSIS — N898 Other specified noninflammatory disorders of vagina: Secondary | ICD-10-CM | POA: Diagnosis present

## 2024-12-23 MED ORDER — FLUCONAZOLE 150 MG PO TABS
ORAL_TABLET | ORAL | 1 refills | Status: AC
  Filled 2024-12-23: qty 2, 6d supply, fill #0

## 2024-12-23 MED ORDER — METRONIDAZOLE 500 MG PO TABS
500.0000 mg | ORAL_TABLET | Freq: Two times a day (BID) | ORAL | 0 refills | Status: AC
  Filled 2024-12-23: qty 14, 7d supply, fill #0

## 2024-12-23 NOTE — Discharge Instructions (Signed)
 Swab sent for BV and yeast, treated with flagyl  for BV and Diflucan  for yeast, will call with results

## 2024-12-23 NOTE — ED Triage Notes (Signed)
 Pt presents c/o vaginal discharge and discomfort x 4 days. Pt states,  I think I have a yeast infection. I have a little itch going on, an odor, and discharge.

## 2024-12-23 NOTE — ED Provider Notes (Signed)
 " [name]    CSN: [id number] Arrival date & time: 12/23/24  1841      History   Chief Complaint Chief Complaint  Patient presents with   Vaginal Itching    Entered by patient    HPI Danielle Robertson is a 24 y.o. female.   Pt presents today due to malodorous vaginal discharge and vaginal itching for the past 4 days. Pt denies concern for Stis.   The history is provided by the patient.  Vaginal Itching    Past Medical History:  Diagnosis Date   Breakthrough bleeding on depo provera  05/17/2022    Patient Active Problem List   Diagnosis Date Noted   Oral contraceptive use 09/09/2022   Dysmenorrhea 09/09/2022    History reviewed. No pertinent surgical history.  OB History     Gravida  0   Para  0   Term  0   Preterm  0   AB  0   Living  0      SAB  0   IAB  0   Ectopic  0   Multiple  0   Live Births  0            Home Medications    Prior to Admission medications  Medication Sig Start Date End Date Taking? Authorizing Provider  fluconazole  (DIFLUCAN ) 150 MG tablet Take 1 tab po every 3 days 12/23/24  Yes Andra Krabbe C, PA-C  metroNIDAZOLE  (FLAGYL ) 500 MG tablet Take 1 tablet (500 mg total) by mouth 2 (two) times daily. 12/23/24  Yes Andra Krabbe C, PA-C  triamcinolone (KENALOG) 0.025 % cream Apply topically 2 (two) times daily. 10/28/24  Yes [provider]  benzonatate  (TESSALON ) 100 MG capsule Take 1 capsule (100 mg total) by mouth 3 (three) times daily as needed for cough. 09/15/24   Vonna Sharlet POUR, MD  ibuprofen  (ADVIL ) 600 MG tablet Take 1 tablet (600 mg total) by mouth every 8 (eight) hours as needed (pain). 09/15/24   Vonna Sharlet POUR, MD  albuterol  (VENTOLIN  HFA) 108 [id number] Base) MCG/ACT inhaler Inhale 1-2 puffs into the lungs every 6 (six) hours as needed for wheezing or shortness of breath. 12/04/19 05/17/20  Babara, Amy V, PA-C  Norethindrone-Ethinyl Estradiol -Fe Biphas (LO LOESTRIN FE) 1 MG-10 MCG /  10 MCG tablet Take 1 tablet by mouth daily. 11/08/19 05/17/20  Leftwich-Kirby, Olam LABOR, CNM    Family History Family History  Problem Relation Age of Onset   Diabetes Maternal Grandmother    Diabetes Maternal Grandfather    COPD Paternal Grandmother        Smoker     Social History Social History[1]   Allergies   Patient has no known allergies.   Review of Systems Review of Systems   Physical Exam Triage Vital Signs ED Triage Vitals  Encounter Vitals Group     BP 12/23/24 1908 120/73     Girls Systolic BP Percentile --      Girls Diastolic BP Percentile --      Boys Systolic BP Percentile --      Boys Diastolic BP Percentile --      Pulse Rate 12/23/24 1908 95     Resp 12/23/24 1908 18     Temp 12/23/24 1908 98.5 F (36.9 C)     Temp Source 12/23/24 1908 Oral     SpO2 12/23/24 1908 99 %     Weight 12/23/24 1907 143 lb 1.3 oz (64.9 kg)  Height --      Head Circumference --      Peak Flow --      Pain Score 12/23/24 1906 0     Pain Loc --      Pain Education --      Exclude from Growth Chart --    No data found.  Updated Vital Signs BP 120/73 (BP Location: Right Arm)   Pulse 95   Temp 98.5 F (36.9 C) (Oral)   Resp 18   Wt 143 lb 1.3 oz (64.9 kg)   LMP 11/28/2024 (Exact Date)   SpO2 99%   BMI 21.76 kg/m   Visual Acuity Right Eye Distance:   Left Eye Distance:   Bilateral Distance:    Right Eye Near:   Left Eye Near:    Bilateral Near:     Physical Exam Vitals and nursing note reviewed.  Constitutional:      General: She is not in acute distress.    Appearance: Normal appearance. She is not ill-appearing, toxic-appearing or diaphoretic.  Eyes:     General: No scleral icterus. Cardiovascular:     Rate and Rhythm: Normal rate and regular rhythm.     Heart sounds: Normal heart sounds.  Pulmonary:     Effort: Pulmonary effort is normal. No respiratory distress.     Breath sounds: Normal breath sounds. No wheezing or rhonchi.  Abdominal:      General: Abdomen is flat. Bowel sounds are normal.     Palpations: Abdomen is soft.     Tenderness: There is no abdominal tenderness. There is no right CVA tenderness or left CVA tenderness.  Skin:    General: Skin is warm.  Neurological:     Mental Status: She is alert and oriented to person, place, and time.  Psychiatric:        Mood and Affect: Mood normal.        Behavior: Behavior normal.      UC Treatments / Results  Labs (all labs ordered are listed, but only abnormal results are displayed) Labs Reviewed  CERVICOVAGINAL ANCILLARY ONLY    EKG   Radiology No results found.  Procedures Procedures (including critical care time)  Medications Ordered in UC Medications - No data to display  Initial Impression / Assessment and Plan / UC Course  I have reviewed the triage vital signs and the nursing notes.  Pertinent labs & imaging results that were available during my care of the patient were reviewed by me and considered in my medical decision making (see chart for details).      Final Clinical Impressions(s) / UC Diagnoses   Final diagnoses:  Acute vaginitis     Discharge Instructions      Swab sent for BV and yeast, treated with flagyl  for BV and Diflucan  for yeast, will call with results    ED Prescriptions     Medication Sig Dispense Auth. Provider   fluconazole  (DIFLUCAN ) 150 MG tablet Take 1 tab po every 3 days 2 tablet Andra Krabbe C, PA-C   metroNIDAZOLE  (FLAGYL ) 500 MG tablet Take 1 tablet (500 mg total) by mouth 2 (two) times daily. 14 tablet Andra Krabbe BROCKS, PA-C      PDMP not reviewed this encounter.    [1]  Social History Tobacco Use   Smoking status: Never    Passive exposure: Never   Smokeless tobacco: Never  Vaping Use   Vaping status: Former   Substances: Nicotine, Flavoring  Substance Use Topics  Alcohol use: Yes    Comment: occ   Drug use: No     Andra Corean BROCKS, PA-C 12/23/24 1932  "

## 2024-12-24 LAB — CERVICOVAGINAL ANCILLARY ONLY
Bacterial Vaginitis (gardnerella): NEGATIVE
Candida Glabrata: NEGATIVE
Candida Vaginitis: NEGATIVE
Comment: NEGATIVE
Comment: NEGATIVE
Comment: NEGATIVE

## 2025-01-06 ENCOUNTER — Ambulatory Visit: Admitting: Family

## 2025-06-22 ENCOUNTER — Ambulatory Visit: Admitting: Physician Assistant
